# Patient Record
Sex: Male | Born: 1940
Health system: Southern US, Community
[De-identification: ages and names within clinical notes are randomized; demographics above are authoritative.]

## PROBLEM LIST (undated history)

## (undated) DIAGNOSIS — F329 Major depressive disorder, single episode, unspecified: Secondary | ICD-10-CM

## (undated) DIAGNOSIS — F32A Depression, unspecified: Secondary | ICD-10-CM

## (undated) DIAGNOSIS — G709 Myoneural disorder, unspecified: Secondary | ICD-10-CM

## (undated) DIAGNOSIS — Z8719 Personal history of other diseases of the digestive system: Secondary | ICD-10-CM

## (undated) HISTORY — PX: CHOLECYSTECTOMY: SHX55

---

## 1997-10-09 ENCOUNTER — Ambulatory Visit (HOSPITAL_COMMUNITY): Admission: RE | Admit: 1997-10-09 | Discharge: 1997-10-09 | Payer: Self-pay | Admitting: Gastroenterology

## 2001-08-07 ENCOUNTER — Other Ambulatory Visit: Admission: RE | Admit: 2001-08-07 | Discharge: 2001-08-07 | Payer: Self-pay | Admitting: Urology

## 2003-05-26 ENCOUNTER — Ambulatory Visit (HOSPITAL_COMMUNITY): Admission: RE | Admit: 2003-05-26 | Discharge: 2003-05-26 | Payer: Self-pay | Admitting: General Surgery

## 2003-07-29 ENCOUNTER — Ambulatory Visit (HOSPITAL_COMMUNITY): Admission: RE | Admit: 2003-07-29 | Discharge: 2003-07-29 | Payer: Self-pay | Admitting: Family Medicine

## 2004-04-28 ENCOUNTER — Ambulatory Visit (HOSPITAL_COMMUNITY): Admission: RE | Admit: 2004-04-28 | Discharge: 2004-04-28 | Payer: Self-pay | Admitting: Family Medicine

## 2006-08-16 ENCOUNTER — Emergency Department (HOSPITAL_COMMUNITY): Admission: EM | Admit: 2006-08-16 | Discharge: 2006-08-16 | Payer: Self-pay | Admitting: Emergency Medicine

## 2010-05-02 ENCOUNTER — Other Ambulatory Visit (HOSPITAL_COMMUNITY): Payer: Self-pay | Admitting: Pulmonary Disease

## 2010-05-02 ENCOUNTER — Ambulatory Visit (HOSPITAL_COMMUNITY)
Admission: RE | Admit: 2010-05-02 | Discharge: 2010-05-02 | Disposition: A | Discharge status: Home / Self Care | Payer: Medicare Other | Source: Ambulatory Visit | Attending: Pulmonary Disease | Admitting: Pulmonary Disease

## 2010-05-02 DIAGNOSIS — R05 Cough: Secondary | ICD-10-CM | POA: Insufficient documentation

## 2010-05-02 DIAGNOSIS — R059 Cough, unspecified: Secondary | ICD-10-CM | POA: Insufficient documentation

## 2010-08-05 NOTE — H&P (Signed)
NAME:  Norman Blake, Norman Blake NO.:  [id number]   MEDICAL RECORD NO.:  [id number]                  PATIENT TYPE:   LOCATION:                                       FACILITY:   PHYSICIAN:  Dalia Heading, M.D.               DATE OF BIRTH:  March 02, 1941   DATE OF ADMISSION:  05/26/2003  DATE OF DISCHARGE:                                HISTORY & PHYSICAL   CHIEF COMPLAINT:  Need for screening colonoscopy.   HISTORY OF PRESENT ILLNESS:  The patient is a 70 year old white male who is  referred for endoscopic evaluation.  He needs a colonoscopy for screening  purposes.  There is no history of abdominal pain, weight loss, nausea,  vomiting, diarrhea, constipation, melena, or hematochezia.  He last had a  colonoscopy many years ago.  There is no family history of colon carcinoma.   PAST MEDICAL HISTORY:  Unremarkable.   PAST SURGICAL HISTORY:  Laparoscopic cholecystectomy in 2002.   CURRENT MEDICATIONS:  Celebrex, baby aspirin, calcium supplements.   ALLERGIES:  No known drug allergies.   REVIEW OF SYSTEMS:  Noncontributory.   PHYSICAL EXAMINATION:  GENERAL:  The patient is a well-developed, well-  nourished white male in no acute distress.  VITAL SIGNS:  He is afebrile and vital signs are stable.  LUNGS:  Clear to auscultation with equal breath sounds bilaterally.  HEART:  Reveals a regular rate and rhythm without S3, S4, or murmurs.  ABDOMEN:  Soft, nontender, nondistended.  No hepatosplenomegaly or masses  are noted.  RECTAL:  Deferred to the procedure.   IMPRESSION:  Need for screening colonoscopy.   PLAN:  The patient is scheduled for a colonoscopy on May 26, 2003.  The  risks and benefits of the procedure including bleeding and perforation were  fully explained to the patient, who gained informed consent.     ___________________________________________                                         Dalia Heading, M.D.   MAJ/MEDQ  D:  05/14/2003  T:   05/14/2003  Job:  [id number]   cc:   Hanley Hays. Dechurch, M.D.  [address]  [place]  [place] [address]  Fax: [phone]

## 2011-03-30 DIAGNOSIS — S13161A Dislocation of C5/C6 cervical vertebrae, initial encounter: Secondary | ICD-10-CM | POA: Diagnosis not present

## 2011-03-30 DIAGNOSIS — M9981 Other biomechanical lesions of cervical region: Secondary | ICD-10-CM | POA: Diagnosis not present

## 2011-03-30 DIAGNOSIS — S332XXA Dislocation of sacroiliac and sacrococcygeal joint, initial encounter: Secondary | ICD-10-CM | POA: Diagnosis not present

## 2011-03-30 DIAGNOSIS — M999 Biomechanical lesion, unspecified: Secondary | ICD-10-CM | POA: Diagnosis not present

## 2011-04-13 DIAGNOSIS — R972 Elevated prostate specific antigen [PSA]: Secondary | ICD-10-CM | POA: Diagnosis not present

## 2011-04-28 DIAGNOSIS — M999 Biomechanical lesion, unspecified: Secondary | ICD-10-CM | POA: Diagnosis not present

## 2011-04-28 DIAGNOSIS — S13161A Dislocation of C5/C6 cervical vertebrae, initial encounter: Secondary | ICD-10-CM | POA: Diagnosis not present

## 2011-04-28 DIAGNOSIS — S332XXA Dislocation of sacroiliac and sacrococcygeal joint, initial encounter: Secondary | ICD-10-CM | POA: Diagnosis not present

## 2011-04-28 DIAGNOSIS — M9981 Other biomechanical lesions of cervical region: Secondary | ICD-10-CM | POA: Diagnosis not present

## 2011-06-05 DIAGNOSIS — M9981 Other biomechanical lesions of cervical region: Secondary | ICD-10-CM | POA: Diagnosis not present

## 2011-06-05 DIAGNOSIS — S332XXA Dislocation of sacroiliac and sacrococcygeal joint, initial encounter: Secondary | ICD-10-CM | POA: Diagnosis not present

## 2011-06-05 DIAGNOSIS — S13161A Dislocation of C5/C6 cervical vertebrae, initial encounter: Secondary | ICD-10-CM | POA: Diagnosis not present

## 2011-06-05 DIAGNOSIS — M999 Biomechanical lesion, unspecified: Secondary | ICD-10-CM | POA: Diagnosis not present

## 2011-06-19 DIAGNOSIS — M999 Biomechanical lesion, unspecified: Secondary | ICD-10-CM | POA: Diagnosis not present

## 2011-06-19 DIAGNOSIS — S13161A Dislocation of C5/C6 cervical vertebrae, initial encounter: Secondary | ICD-10-CM | POA: Diagnosis not present

## 2011-06-19 DIAGNOSIS — S332XXA Dislocation of sacroiliac and sacrococcygeal joint, initial encounter: Secondary | ICD-10-CM | POA: Diagnosis not present

## 2011-06-19 DIAGNOSIS — M9981 Other biomechanical lesions of cervical region: Secondary | ICD-10-CM | POA: Diagnosis not present

## 2011-06-20 DIAGNOSIS — L989 Disorder of the skin and subcutaneous tissue, unspecified: Secondary | ICD-10-CM | POA: Diagnosis not present

## 2011-06-20 DIAGNOSIS — F4310 Post-traumatic stress disorder, unspecified: Secondary | ICD-10-CM | POA: Diagnosis not present

## 2011-07-03 DIAGNOSIS — L570 Actinic keratosis: Secondary | ICD-10-CM | POA: Diagnosis not present

## 2011-07-03 DIAGNOSIS — D235 Other benign neoplasm of skin of trunk: Secondary | ICD-10-CM | POA: Diagnosis not present

## 2011-07-03 DIAGNOSIS — C44319 Basal cell carcinoma of skin of other parts of face: Secondary | ICD-10-CM | POA: Diagnosis not present

## 2011-07-03 DIAGNOSIS — D485 Neoplasm of uncertain behavior of skin: Secondary | ICD-10-CM | POA: Diagnosis not present

## 2011-07-05 DIAGNOSIS — S332XXA Dislocation of sacroiliac and sacrococcygeal joint, initial encounter: Secondary | ICD-10-CM | POA: Diagnosis not present

## 2011-07-05 DIAGNOSIS — S13161A Dislocation of C5/C6 cervical vertebrae, initial encounter: Secondary | ICD-10-CM | POA: Diagnosis not present

## 2011-07-05 DIAGNOSIS — M999 Biomechanical lesion, unspecified: Secondary | ICD-10-CM | POA: Diagnosis not present

## 2011-07-05 DIAGNOSIS — M9981 Other biomechanical lesions of cervical region: Secondary | ICD-10-CM | POA: Diagnosis not present

## 2011-07-10 DIAGNOSIS — M9981 Other biomechanical lesions of cervical region: Secondary | ICD-10-CM | POA: Diagnosis not present

## 2011-07-10 DIAGNOSIS — M999 Biomechanical lesion, unspecified: Secondary | ICD-10-CM | POA: Diagnosis not present

## 2011-07-10 DIAGNOSIS — S332XXA Dislocation of sacroiliac and sacrococcygeal joint, initial encounter: Secondary | ICD-10-CM | POA: Diagnosis not present

## 2011-07-10 DIAGNOSIS — S13161A Dislocation of C5/C6 cervical vertebrae, initial encounter: Secondary | ICD-10-CM | POA: Diagnosis not present

## 2011-07-21 DIAGNOSIS — R972 Elevated prostate specific antigen [PSA]: Secondary | ICD-10-CM | POA: Diagnosis not present

## 2011-07-26 DIAGNOSIS — M9981 Other biomechanical lesions of cervical region: Secondary | ICD-10-CM | POA: Diagnosis not present

## 2011-07-26 DIAGNOSIS — S13161A Dislocation of C5/C6 cervical vertebrae, initial encounter: Secondary | ICD-10-CM | POA: Diagnosis not present

## 2011-07-26 DIAGNOSIS — M999 Biomechanical lesion, unspecified: Secondary | ICD-10-CM | POA: Diagnosis not present

## 2011-07-26 DIAGNOSIS — S332XXA Dislocation of sacroiliac and sacrococcygeal joint, initial encounter: Secondary | ICD-10-CM | POA: Diagnosis not present

## 2011-08-04 DIAGNOSIS — S332XXA Dislocation of sacroiliac and sacrococcygeal joint, initial encounter: Secondary | ICD-10-CM | POA: Diagnosis not present

## 2011-08-04 DIAGNOSIS — M9981 Other biomechanical lesions of cervical region: Secondary | ICD-10-CM | POA: Diagnosis not present

## 2011-08-04 DIAGNOSIS — S13161A Dislocation of C5/C6 cervical vertebrae, initial encounter: Secondary | ICD-10-CM | POA: Diagnosis not present

## 2011-08-04 DIAGNOSIS — M999 Biomechanical lesion, unspecified: Secondary | ICD-10-CM | POA: Diagnosis not present

## 2011-08-07 DIAGNOSIS — L570 Actinic keratosis: Secondary | ICD-10-CM | POA: Diagnosis not present

## 2011-08-07 DIAGNOSIS — L821 Other seborrheic keratosis: Secondary | ICD-10-CM | POA: Diagnosis not present

## 2011-08-07 DIAGNOSIS — Z85828 Personal history of other malignant neoplasm of skin: Secondary | ICD-10-CM | POA: Diagnosis not present

## 2011-08-07 DIAGNOSIS — D485 Neoplasm of uncertain behavior of skin: Secondary | ICD-10-CM | POA: Diagnosis not present

## 2011-08-09 DIAGNOSIS — M545 Low back pain: Secondary | ICD-10-CM | POA: Diagnosis not present

## 2011-08-23 DIAGNOSIS — M545 Low back pain: Secondary | ICD-10-CM | POA: Diagnosis not present

## 2011-10-06 DIAGNOSIS — M9981 Other biomechanical lesions of cervical region: Secondary | ICD-10-CM | POA: Diagnosis not present

## 2011-10-06 DIAGNOSIS — S13161A Dislocation of C5/C6 cervical vertebrae, initial encounter: Secondary | ICD-10-CM | POA: Diagnosis not present

## 2011-10-06 DIAGNOSIS — S332XXA Dislocation of sacroiliac and sacrococcygeal joint, initial encounter: Secondary | ICD-10-CM | POA: Diagnosis not present

## 2011-10-06 DIAGNOSIS — M999 Biomechanical lesion, unspecified: Secondary | ICD-10-CM | POA: Diagnosis not present

## 2011-10-12 DIAGNOSIS — R972 Elevated prostate specific antigen [PSA]: Secondary | ICD-10-CM | POA: Diagnosis not present

## 2011-10-18 DIAGNOSIS — R351 Nocturia: Secondary | ICD-10-CM | POA: Diagnosis not present

## 2011-10-18 DIAGNOSIS — R339 Retention of urine, unspecified: Secondary | ICD-10-CM | POA: Diagnosis not present

## 2011-10-18 DIAGNOSIS — R350 Frequency of micturition: Secondary | ICD-10-CM | POA: Diagnosis not present

## 2011-10-18 DIAGNOSIS — R972 Elevated prostate specific antigen [PSA]: Secondary | ICD-10-CM | POA: Diagnosis not present

## 2011-10-23 DIAGNOSIS — M9981 Other biomechanical lesions of cervical region: Secondary | ICD-10-CM | POA: Diagnosis not present

## 2011-10-23 DIAGNOSIS — M999 Biomechanical lesion, unspecified: Secondary | ICD-10-CM | POA: Diagnosis not present

## 2011-10-23 DIAGNOSIS — S13161A Dislocation of C5/C6 cervical vertebrae, initial encounter: Secondary | ICD-10-CM | POA: Diagnosis not present

## 2011-10-23 DIAGNOSIS — S332XXA Dislocation of sacroiliac and sacrococcygeal joint, initial encounter: Secondary | ICD-10-CM | POA: Diagnosis not present

## 2011-11-02 DIAGNOSIS — M999 Biomechanical lesion, unspecified: Secondary | ICD-10-CM | POA: Diagnosis not present

## 2011-11-02 DIAGNOSIS — S13161A Dislocation of C5/C6 cervical vertebrae, initial encounter: Secondary | ICD-10-CM | POA: Diagnosis not present

## 2011-11-02 DIAGNOSIS — M9981 Other biomechanical lesions of cervical region: Secondary | ICD-10-CM | POA: Diagnosis not present

## 2011-11-02 DIAGNOSIS — S332XXA Dislocation of sacroiliac and sacrococcygeal joint, initial encounter: Secondary | ICD-10-CM | POA: Diagnosis not present

## 2011-11-14 DIAGNOSIS — R350 Frequency of micturition: Secondary | ICD-10-CM | POA: Diagnosis not present

## 2011-11-14 DIAGNOSIS — R351 Nocturia: Secondary | ICD-10-CM | POA: Diagnosis not present

## 2011-11-14 DIAGNOSIS — R339 Retention of urine, unspecified: Secondary | ICD-10-CM | POA: Diagnosis not present

## 2011-11-29 DIAGNOSIS — S332XXA Dislocation of sacroiliac and sacrococcygeal joint, initial encounter: Secondary | ICD-10-CM | POA: Diagnosis not present

## 2011-11-29 DIAGNOSIS — M9981 Other biomechanical lesions of cervical region: Secondary | ICD-10-CM | POA: Diagnosis not present

## 2011-11-29 DIAGNOSIS — M999 Biomechanical lesion, unspecified: Secondary | ICD-10-CM | POA: Diagnosis not present

## 2011-11-29 DIAGNOSIS — S13161A Dislocation of C5/C6 cervical vertebrae, initial encounter: Secondary | ICD-10-CM | POA: Diagnosis not present

## 2011-12-20 DIAGNOSIS — N400 Enlarged prostate without lower urinary tract symptoms: Secondary | ICD-10-CM | POA: Diagnosis not present

## 2011-12-20 DIAGNOSIS — M545 Low back pain: Secondary | ICD-10-CM | POA: Diagnosis not present

## 2012-01-08 DIAGNOSIS — S13161A Dislocation of C5/C6 cervical vertebrae, initial encounter: Secondary | ICD-10-CM | POA: Diagnosis not present

## 2012-01-08 DIAGNOSIS — M9981 Other biomechanical lesions of cervical region: Secondary | ICD-10-CM | POA: Diagnosis not present

## 2012-01-08 DIAGNOSIS — S332XXA Dislocation of sacroiliac and sacrococcygeal joint, initial encounter: Secondary | ICD-10-CM | POA: Diagnosis not present

## 2012-01-08 DIAGNOSIS — M999 Biomechanical lesion, unspecified: Secondary | ICD-10-CM | POA: Diagnosis not present

## 2012-02-19 DIAGNOSIS — M999 Biomechanical lesion, unspecified: Secondary | ICD-10-CM | POA: Diagnosis not present

## 2012-02-19 DIAGNOSIS — S332XXA Dislocation of sacroiliac and sacrococcygeal joint, initial encounter: Secondary | ICD-10-CM | POA: Diagnosis not present

## 2012-02-19 DIAGNOSIS — M9981 Other biomechanical lesions of cervical region: Secondary | ICD-10-CM | POA: Diagnosis not present

## 2012-02-19 DIAGNOSIS — S13161A Dislocation of C5/C6 cervical vertebrae, initial encounter: Secondary | ICD-10-CM | POA: Diagnosis not present

## 2012-02-26 DIAGNOSIS — M9981 Other biomechanical lesions of cervical region: Secondary | ICD-10-CM | POA: Diagnosis not present

## 2012-02-26 DIAGNOSIS — M999 Biomechanical lesion, unspecified: Secondary | ICD-10-CM | POA: Diagnosis not present

## 2012-02-26 DIAGNOSIS — S13161A Dislocation of C5/C6 cervical vertebrae, initial encounter: Secondary | ICD-10-CM | POA: Diagnosis not present

## 2012-02-26 DIAGNOSIS — S332XXA Dislocation of sacroiliac and sacrococcygeal joint, initial encounter: Secondary | ICD-10-CM | POA: Diagnosis not present

## 2012-02-28 DIAGNOSIS — M999 Biomechanical lesion, unspecified: Secondary | ICD-10-CM | POA: Diagnosis not present

## 2012-02-28 DIAGNOSIS — M9981 Other biomechanical lesions of cervical region: Secondary | ICD-10-CM | POA: Diagnosis not present

## 2012-02-28 DIAGNOSIS — S13161A Dislocation of C5/C6 cervical vertebrae, initial encounter: Secondary | ICD-10-CM | POA: Diagnosis not present

## 2012-02-28 DIAGNOSIS — S332XXA Dislocation of sacroiliac and sacrococcygeal joint, initial encounter: Secondary | ICD-10-CM | POA: Diagnosis not present

## 2012-04-09 DIAGNOSIS — R972 Elevated prostate specific antigen [PSA]: Secondary | ICD-10-CM | POA: Diagnosis not present

## 2012-04-15 DIAGNOSIS — R972 Elevated prostate specific antigen [PSA]: Secondary | ICD-10-CM | POA: Diagnosis not present

## 2012-04-15 DIAGNOSIS — R350 Frequency of micturition: Secondary | ICD-10-CM | POA: Diagnosis not present

## 2012-04-15 DIAGNOSIS — R351 Nocturia: Secondary | ICD-10-CM | POA: Diagnosis not present

## 2012-04-17 DIAGNOSIS — S332XXA Dislocation of sacroiliac and sacrococcygeal joint, initial encounter: Secondary | ICD-10-CM | POA: Diagnosis not present

## 2012-04-17 DIAGNOSIS — M999 Biomechanical lesion, unspecified: Secondary | ICD-10-CM | POA: Diagnosis not present

## 2012-04-17 DIAGNOSIS — S13171A Dislocation of C6/C7 cervical vertebrae, initial encounter: Secondary | ICD-10-CM | POA: Diagnosis not present

## 2012-04-17 DIAGNOSIS — M9981 Other biomechanical lesions of cervical region: Secondary | ICD-10-CM | POA: Diagnosis not present

## 2012-05-17 DIAGNOSIS — S332XXA Dislocation of sacroiliac and sacrococcygeal joint, initial encounter: Secondary | ICD-10-CM | POA: Diagnosis not present

## 2012-05-17 DIAGNOSIS — M999 Biomechanical lesion, unspecified: Secondary | ICD-10-CM | POA: Diagnosis not present

## 2012-05-17 DIAGNOSIS — M9981 Other biomechanical lesions of cervical region: Secondary | ICD-10-CM | POA: Diagnosis not present

## 2012-05-17 DIAGNOSIS — S13171A Dislocation of C6/C7 cervical vertebrae, initial encounter: Secondary | ICD-10-CM | POA: Diagnosis not present

## 2012-05-21 DIAGNOSIS — M9981 Other biomechanical lesions of cervical region: Secondary | ICD-10-CM | POA: Diagnosis not present

## 2012-05-21 DIAGNOSIS — S13171A Dislocation of C6/C7 cervical vertebrae, initial encounter: Secondary | ICD-10-CM | POA: Diagnosis not present

## 2012-05-21 DIAGNOSIS — S332XXA Dislocation of sacroiliac and sacrococcygeal joint, initial encounter: Secondary | ICD-10-CM | POA: Diagnosis not present

## 2012-05-21 DIAGNOSIS — M999 Biomechanical lesion, unspecified: Secondary | ICD-10-CM | POA: Diagnosis not present

## 2012-05-28 DIAGNOSIS — M9981 Other biomechanical lesions of cervical region: Secondary | ICD-10-CM | POA: Diagnosis not present

## 2012-05-28 DIAGNOSIS — S13171A Dislocation of C6/C7 cervical vertebrae, initial encounter: Secondary | ICD-10-CM | POA: Diagnosis not present

## 2012-05-28 DIAGNOSIS — S332XXA Dislocation of sacroiliac and sacrococcygeal joint, initial encounter: Secondary | ICD-10-CM | POA: Diagnosis not present

## 2012-05-28 DIAGNOSIS — M999 Biomechanical lesion, unspecified: Secondary | ICD-10-CM | POA: Diagnosis not present

## 2012-06-18 DIAGNOSIS — S332XXA Dislocation of sacroiliac and sacrococcygeal joint, initial encounter: Secondary | ICD-10-CM | POA: Diagnosis not present

## 2012-06-18 DIAGNOSIS — S13171A Dislocation of C6/C7 cervical vertebrae, initial encounter: Secondary | ICD-10-CM | POA: Diagnosis not present

## 2012-06-18 DIAGNOSIS — M9981 Other biomechanical lesions of cervical region: Secondary | ICD-10-CM | POA: Diagnosis not present

## 2012-06-18 DIAGNOSIS — M999 Biomechanical lesion, unspecified: Secondary | ICD-10-CM | POA: Diagnosis not present

## 2012-06-19 DIAGNOSIS — G589 Mononeuropathy, unspecified: Secondary | ICD-10-CM | POA: Diagnosis not present

## 2012-06-19 DIAGNOSIS — M4800 Spinal stenosis, site unspecified: Secondary | ICD-10-CM | POA: Diagnosis not present

## 2012-06-19 DIAGNOSIS — N400 Enlarged prostate without lower urinary tract symptoms: Secondary | ICD-10-CM | POA: Diagnosis not present

## 2012-06-28 DIAGNOSIS — S13171A Dislocation of C6/C7 cervical vertebrae, initial encounter: Secondary | ICD-10-CM | POA: Diagnosis not present

## 2012-06-28 DIAGNOSIS — M999 Biomechanical lesion, unspecified: Secondary | ICD-10-CM | POA: Diagnosis not present

## 2012-06-28 DIAGNOSIS — S332XXA Dislocation of sacroiliac and sacrococcygeal joint, initial encounter: Secondary | ICD-10-CM | POA: Diagnosis not present

## 2012-06-28 DIAGNOSIS — M9981 Other biomechanical lesions of cervical region: Secondary | ICD-10-CM | POA: Diagnosis not present

## 2012-07-15 DIAGNOSIS — F4310 Post-traumatic stress disorder, unspecified: Secondary | ICD-10-CM | POA: Diagnosis not present

## 2012-07-15 DIAGNOSIS — R42 Dizziness and giddiness: Secondary | ICD-10-CM | POA: Diagnosis not present

## 2012-07-15 DIAGNOSIS — M4800 Spinal stenosis, site unspecified: Secondary | ICD-10-CM | POA: Diagnosis not present

## 2012-07-15 DIAGNOSIS — G589 Mononeuropathy, unspecified: Secondary | ICD-10-CM | POA: Diagnosis not present

## 2012-07-16 DIAGNOSIS — M9981 Other biomechanical lesions of cervical region: Secondary | ICD-10-CM | POA: Diagnosis not present

## 2012-07-16 DIAGNOSIS — M999 Biomechanical lesion, unspecified: Secondary | ICD-10-CM | POA: Diagnosis not present

## 2012-07-16 DIAGNOSIS — S332XXA Dislocation of sacroiliac and sacrococcygeal joint, initial encounter: Secondary | ICD-10-CM | POA: Diagnosis not present

## 2012-07-16 DIAGNOSIS — S13121A Dislocation of C1/C2 cervical vertebrae, initial encounter: Secondary | ICD-10-CM | POA: Diagnosis not present

## 2012-07-25 DIAGNOSIS — S13121A Dislocation of C1/C2 cervical vertebrae, initial encounter: Secondary | ICD-10-CM | POA: Diagnosis not present

## 2012-07-25 DIAGNOSIS — S332XXA Dislocation of sacroiliac and sacrococcygeal joint, initial encounter: Secondary | ICD-10-CM | POA: Diagnosis not present

## 2012-07-25 DIAGNOSIS — M999 Biomechanical lesion, unspecified: Secondary | ICD-10-CM | POA: Diagnosis not present

## 2012-07-25 DIAGNOSIS — M9981 Other biomechanical lesions of cervical region: Secondary | ICD-10-CM | POA: Diagnosis not present

## 2012-08-08 ENCOUNTER — Ambulatory Visit (INDEPENDENT_AMBULATORY_CARE_PROVIDER_SITE_OTHER): Payer: Medicare Other | Admitting: Otolaryngology

## 2012-08-08 DIAGNOSIS — H6120 Impacted cerumen, unspecified ear: Secondary | ICD-10-CM | POA: Diagnosis not present

## 2012-08-08 DIAGNOSIS — H903 Sensorineural hearing loss, bilateral: Secondary | ICD-10-CM | POA: Diagnosis not present

## 2012-08-12 DIAGNOSIS — M9981 Other biomechanical lesions of cervical region: Secondary | ICD-10-CM | POA: Diagnosis not present

## 2012-08-12 DIAGNOSIS — M999 Biomechanical lesion, unspecified: Secondary | ICD-10-CM | POA: Diagnosis not present

## 2012-08-12 DIAGNOSIS — S13121A Dislocation of C1/C2 cervical vertebrae, initial encounter: Secondary | ICD-10-CM | POA: Diagnosis not present

## 2012-08-12 DIAGNOSIS — S332XXA Dislocation of sacroiliac and sacrococcygeal joint, initial encounter: Secondary | ICD-10-CM | POA: Diagnosis not present

## 2012-09-03 DIAGNOSIS — M62838 Other muscle spasm: Secondary | ICD-10-CM | POA: Diagnosis not present

## 2012-09-03 DIAGNOSIS — E538 Deficiency of other specified B group vitamins: Secondary | ICD-10-CM | POA: Diagnosis not present

## 2012-09-03 DIAGNOSIS — M545 Low back pain: Secondary | ICD-10-CM | POA: Diagnosis not present

## 2012-09-03 DIAGNOSIS — G609 Hereditary and idiopathic neuropathy, unspecified: Secondary | ICD-10-CM | POA: Diagnosis not present

## 2012-09-03 DIAGNOSIS — R209 Unspecified disturbances of skin sensation: Secondary | ICD-10-CM | POA: Diagnosis not present

## 2012-09-03 DIAGNOSIS — I2510 Atherosclerotic heart disease of native coronary artery without angina pectoris: Secondary | ICD-10-CM | POA: Diagnosis not present

## 2012-09-03 DIAGNOSIS — Z79899 Other long term (current) drug therapy: Secondary | ICD-10-CM | POA: Diagnosis not present

## 2012-09-03 DIAGNOSIS — R5381 Other malaise: Secondary | ICD-10-CM | POA: Diagnosis not present

## 2012-09-03 DIAGNOSIS — M810 Age-related osteoporosis without current pathological fracture: Secondary | ICD-10-CM | POA: Diagnosis not present

## 2012-09-11 DIAGNOSIS — G894 Chronic pain syndrome: Secondary | ICD-10-CM | POA: Diagnosis not present

## 2012-09-11 DIAGNOSIS — IMO0002 Reserved for concepts with insufficient information to code with codable children: Secondary | ICD-10-CM | POA: Diagnosis not present

## 2012-09-11 DIAGNOSIS — G609 Hereditary and idiopathic neuropathy, unspecified: Secondary | ICD-10-CM | POA: Diagnosis not present

## 2012-10-01 DIAGNOSIS — G609 Hereditary and idiopathic neuropathy, unspecified: Secondary | ICD-10-CM | POA: Diagnosis not present

## 2012-10-01 DIAGNOSIS — G2581 Restless legs syndrome: Secondary | ICD-10-CM | POA: Diagnosis not present

## 2012-10-01 DIAGNOSIS — Z79899 Other long term (current) drug therapy: Secondary | ICD-10-CM | POA: Diagnosis not present

## 2012-10-02 DIAGNOSIS — D235 Other benign neoplasm of skin of trunk: Secondary | ICD-10-CM | POA: Diagnosis not present

## 2012-10-02 DIAGNOSIS — L570 Actinic keratosis: Secondary | ICD-10-CM | POA: Diagnosis not present

## 2012-10-11 DIAGNOSIS — M999 Biomechanical lesion, unspecified: Secondary | ICD-10-CM | POA: Diagnosis not present

## 2012-10-11 DIAGNOSIS — S13121A Dislocation of C1/C2 cervical vertebrae, initial encounter: Secondary | ICD-10-CM | POA: Diagnosis not present

## 2012-10-11 DIAGNOSIS — M9981 Other biomechanical lesions of cervical region: Secondary | ICD-10-CM | POA: Diagnosis not present

## 2012-10-11 DIAGNOSIS — S332XXA Dislocation of sacroiliac and sacrococcygeal joint, initial encounter: Secondary | ICD-10-CM | POA: Diagnosis not present

## 2012-10-14 DIAGNOSIS — R972 Elevated prostate specific antigen [PSA]: Secondary | ICD-10-CM | POA: Diagnosis not present

## 2012-11-12 DIAGNOSIS — Z79899 Other long term (current) drug therapy: Secondary | ICD-10-CM | POA: Diagnosis not present

## 2012-11-12 DIAGNOSIS — R269 Unspecified abnormalities of gait and mobility: Secondary | ICD-10-CM | POA: Diagnosis not present

## 2012-11-12 DIAGNOSIS — G894 Chronic pain syndrome: Secondary | ICD-10-CM | POA: Diagnosis not present

## 2012-11-12 DIAGNOSIS — G609 Hereditary and idiopathic neuropathy, unspecified: Secondary | ICD-10-CM | POA: Diagnosis not present

## 2012-11-14 DIAGNOSIS — M999 Biomechanical lesion, unspecified: Secondary | ICD-10-CM | POA: Diagnosis not present

## 2012-11-14 DIAGNOSIS — S13121A Dislocation of C1/C2 cervical vertebrae, initial encounter: Secondary | ICD-10-CM | POA: Diagnosis not present

## 2012-11-14 DIAGNOSIS — S332XXA Dislocation of sacroiliac and sacrococcygeal joint, initial encounter: Secondary | ICD-10-CM | POA: Diagnosis not present

## 2012-11-14 DIAGNOSIS — M9981 Other biomechanical lesions of cervical region: Secondary | ICD-10-CM | POA: Diagnosis not present

## 2012-11-26 DIAGNOSIS — M999 Biomechanical lesion, unspecified: Secondary | ICD-10-CM | POA: Diagnosis not present

## 2012-11-26 DIAGNOSIS — S332XXA Dislocation of sacroiliac and sacrococcygeal joint, initial encounter: Secondary | ICD-10-CM | POA: Diagnosis not present

## 2012-11-26 DIAGNOSIS — M9981 Other biomechanical lesions of cervical region: Secondary | ICD-10-CM | POA: Diagnosis not present

## 2012-11-26 DIAGNOSIS — S13121A Dislocation of C1/C2 cervical vertebrae, initial encounter: Secondary | ICD-10-CM | POA: Diagnosis not present

## 2012-12-16 DIAGNOSIS — S13121A Dislocation of C1/C2 cervical vertebrae, initial encounter: Secondary | ICD-10-CM | POA: Diagnosis not present

## 2012-12-16 DIAGNOSIS — S332XXA Dislocation of sacroiliac and sacrococcygeal joint, initial encounter: Secondary | ICD-10-CM | POA: Diagnosis not present

## 2012-12-16 DIAGNOSIS — M999 Biomechanical lesion, unspecified: Secondary | ICD-10-CM | POA: Diagnosis not present

## 2012-12-16 DIAGNOSIS — M9981 Other biomechanical lesions of cervical region: Secondary | ICD-10-CM | POA: Diagnosis not present

## 2013-01-01 DIAGNOSIS — G589 Mononeuropathy, unspecified: Secondary | ICD-10-CM | POA: Diagnosis not present

## 2013-01-01 DIAGNOSIS — M4800 Spinal stenosis, site unspecified: Secondary | ICD-10-CM | POA: Diagnosis not present

## 2013-01-01 DIAGNOSIS — F4310 Post-traumatic stress disorder, unspecified: Secondary | ICD-10-CM | POA: Diagnosis not present

## 2013-01-02 DIAGNOSIS — M999 Biomechanical lesion, unspecified: Secondary | ICD-10-CM | POA: Diagnosis not present

## 2013-01-02 DIAGNOSIS — S332XXA Dislocation of sacroiliac and sacrococcygeal joint, initial encounter: Secondary | ICD-10-CM | POA: Diagnosis not present

## 2013-01-02 DIAGNOSIS — S13121A Dislocation of C1/C2 cervical vertebrae, initial encounter: Secondary | ICD-10-CM | POA: Diagnosis not present

## 2013-01-02 DIAGNOSIS — M9981 Other biomechanical lesions of cervical region: Secondary | ICD-10-CM | POA: Diagnosis not present

## 2013-01-06 DIAGNOSIS — Z23 Encounter for immunization: Secondary | ICD-10-CM | POA: Diagnosis not present

## 2013-01-06 DIAGNOSIS — J209 Acute bronchitis, unspecified: Secondary | ICD-10-CM | POA: Diagnosis not present

## 2013-01-16 DIAGNOSIS — Z79899 Other long term (current) drug therapy: Secondary | ICD-10-CM | POA: Diagnosis not present

## 2013-01-16 DIAGNOSIS — G894 Chronic pain syndrome: Secondary | ICD-10-CM | POA: Diagnosis not present

## 2013-01-16 DIAGNOSIS — M25569 Pain in unspecified knee: Secondary | ICD-10-CM | POA: Diagnosis not present

## 2013-01-16 DIAGNOSIS — G609 Hereditary and idiopathic neuropathy, unspecified: Secondary | ICD-10-CM | POA: Diagnosis not present

## 2013-01-21 DIAGNOSIS — S23101A Dislocation of unspecified thoracic vertebra, initial encounter: Secondary | ICD-10-CM | POA: Diagnosis not present

## 2013-01-21 DIAGNOSIS — S332XXA Dislocation of sacroiliac and sacrococcygeal joint, initial encounter: Secondary | ICD-10-CM | POA: Diagnosis not present

## 2013-01-21 DIAGNOSIS — M999 Biomechanical lesion, unspecified: Secondary | ICD-10-CM | POA: Diagnosis not present

## 2013-03-17 DIAGNOSIS — G609 Hereditary and idiopathic neuropathy, unspecified: Secondary | ICD-10-CM | POA: Diagnosis not present

## 2013-03-17 DIAGNOSIS — Z79899 Other long term (current) drug therapy: Secondary | ICD-10-CM | POA: Diagnosis not present

## 2013-03-17 DIAGNOSIS — R42 Dizziness and giddiness: Secondary | ICD-10-CM | POA: Diagnosis not present

## 2013-03-17 DIAGNOSIS — G894 Chronic pain syndrome: Secondary | ICD-10-CM | POA: Diagnosis not present

## 2013-03-27 DIAGNOSIS — M999 Biomechanical lesion, unspecified: Secondary | ICD-10-CM | POA: Diagnosis not present

## 2013-03-27 DIAGNOSIS — S332XXA Dislocation of sacroiliac and sacrococcygeal joint, initial encounter: Secondary | ICD-10-CM | POA: Diagnosis not present

## 2013-03-27 DIAGNOSIS — S23101A Dislocation of unspecified thoracic vertebra, initial encounter: Secondary | ICD-10-CM | POA: Diagnosis not present

## 2013-04-08 DIAGNOSIS — R972 Elevated prostate specific antigen [PSA]: Secondary | ICD-10-CM | POA: Diagnosis not present

## 2013-04-11 DIAGNOSIS — S332XXA Dislocation of sacroiliac and sacrococcygeal joint, initial encounter: Secondary | ICD-10-CM | POA: Diagnosis not present

## 2013-04-11 DIAGNOSIS — M999 Biomechanical lesion, unspecified: Secondary | ICD-10-CM | POA: Diagnosis not present

## 2013-04-11 DIAGNOSIS — S23101A Dislocation of unspecified thoracic vertebra, initial encounter: Secondary | ICD-10-CM | POA: Diagnosis not present

## 2013-04-15 DIAGNOSIS — R350 Frequency of micturition: Secondary | ICD-10-CM | POA: Diagnosis not present

## 2013-04-15 DIAGNOSIS — R39198 Other difficulties with micturition: Secondary | ICD-10-CM | POA: Diagnosis not present

## 2013-04-15 DIAGNOSIS — R972 Elevated prostate specific antigen [PSA]: Secondary | ICD-10-CM | POA: Diagnosis not present

## 2013-04-15 DIAGNOSIS — R351 Nocturia: Secondary | ICD-10-CM | POA: Diagnosis not present

## 2013-04-22 DIAGNOSIS — S23101A Dislocation of unspecified thoracic vertebra, initial encounter: Secondary | ICD-10-CM | POA: Diagnosis not present

## 2013-04-22 DIAGNOSIS — S332XXA Dislocation of sacroiliac and sacrococcygeal joint, initial encounter: Secondary | ICD-10-CM | POA: Diagnosis not present

## 2013-04-22 DIAGNOSIS — M999 Biomechanical lesion, unspecified: Secondary | ICD-10-CM | POA: Diagnosis not present

## 2013-06-06 DIAGNOSIS — S332XXA Dislocation of sacroiliac and sacrococcygeal joint, initial encounter: Secondary | ICD-10-CM | POA: Diagnosis not present

## 2013-06-06 DIAGNOSIS — S23101A Dislocation of unspecified thoracic vertebra, initial encounter: Secondary | ICD-10-CM | POA: Diagnosis not present

## 2013-06-06 DIAGNOSIS — M999 Biomechanical lesion, unspecified: Secondary | ICD-10-CM | POA: Diagnosis not present

## 2013-06-10 DIAGNOSIS — Z79899 Other long term (current) drug therapy: Secondary | ICD-10-CM | POA: Diagnosis not present

## 2013-06-10 DIAGNOSIS — G609 Hereditary and idiopathic neuropathy, unspecified: Secondary | ICD-10-CM | POA: Diagnosis not present

## 2013-06-10 DIAGNOSIS — G894 Chronic pain syndrome: Secondary | ICD-10-CM | POA: Diagnosis not present

## 2013-06-10 DIAGNOSIS — M25569 Pain in unspecified knee: Secondary | ICD-10-CM | POA: Diagnosis not present

## 2013-07-03 DIAGNOSIS — G619 Inflammatory polyneuropathy, unspecified: Secondary | ICD-10-CM | POA: Diagnosis not present

## 2013-07-03 DIAGNOSIS — N400 Enlarged prostate without lower urinary tract symptoms: Secondary | ICD-10-CM | POA: Diagnosis not present

## 2013-07-03 DIAGNOSIS — F4310 Post-traumatic stress disorder, unspecified: Secondary | ICD-10-CM | POA: Diagnosis not present

## 2013-07-03 DIAGNOSIS — M4800 Spinal stenosis, site unspecified: Secondary | ICD-10-CM | POA: Diagnosis not present

## 2013-07-08 DIAGNOSIS — S23101A Dislocation of unspecified thoracic vertebra, initial encounter: Secondary | ICD-10-CM | POA: Diagnosis not present

## 2013-07-08 DIAGNOSIS — S332XXA Dislocation of sacroiliac and sacrococcygeal joint, initial encounter: Secondary | ICD-10-CM | POA: Diagnosis not present

## 2013-07-08 DIAGNOSIS — M999 Biomechanical lesion, unspecified: Secondary | ICD-10-CM | POA: Diagnosis not present

## 2013-08-05 DIAGNOSIS — S332XXA Dislocation of sacroiliac and sacrococcygeal joint, initial encounter: Secondary | ICD-10-CM | POA: Diagnosis not present

## 2013-08-05 DIAGNOSIS — S23101A Dislocation of unspecified thoracic vertebra, initial encounter: Secondary | ICD-10-CM | POA: Diagnosis not present

## 2013-08-05 DIAGNOSIS — M999 Biomechanical lesion, unspecified: Secondary | ICD-10-CM | POA: Diagnosis not present

## 2013-08-08 DIAGNOSIS — Z79899 Other long term (current) drug therapy: Secondary | ICD-10-CM | POA: Diagnosis not present

## 2013-08-08 DIAGNOSIS — G894 Chronic pain syndrome: Secondary | ICD-10-CM | POA: Diagnosis not present

## 2013-08-08 DIAGNOSIS — M25569 Pain in unspecified knee: Secondary | ICD-10-CM | POA: Diagnosis not present

## 2013-08-08 DIAGNOSIS — G609 Hereditary and idiopathic neuropathy, unspecified: Secondary | ICD-10-CM | POA: Diagnosis not present

## 2013-09-01 DIAGNOSIS — M999 Biomechanical lesion, unspecified: Secondary | ICD-10-CM | POA: Diagnosis not present

## 2013-09-01 DIAGNOSIS — S23101A Dislocation of unspecified thoracic vertebra, initial encounter: Secondary | ICD-10-CM | POA: Diagnosis not present

## 2013-09-01 DIAGNOSIS — S332XXA Dislocation of sacroiliac and sacrococcygeal joint, initial encounter: Secondary | ICD-10-CM | POA: Diagnosis not present

## 2013-09-19 DIAGNOSIS — S13121A Dislocation of C1/C2 cervical vertebrae, initial encounter: Secondary | ICD-10-CM | POA: Diagnosis not present

## 2013-09-19 DIAGNOSIS — M9981 Other biomechanical lesions of cervical region: Secondary | ICD-10-CM | POA: Diagnosis not present

## 2013-10-13 DIAGNOSIS — R972 Elevated prostate specific antigen [PSA]: Secondary | ICD-10-CM | POA: Diagnosis not present

## 2013-10-20 DIAGNOSIS — M9981 Other biomechanical lesions of cervical region: Secondary | ICD-10-CM | POA: Diagnosis not present

## 2013-10-20 DIAGNOSIS — S13121A Dislocation of C1/C2 cervical vertebrae, initial encounter: Secondary | ICD-10-CM | POA: Diagnosis not present

## 2013-11-06 DIAGNOSIS — G894 Chronic pain syndrome: Secondary | ICD-10-CM | POA: Diagnosis not present

## 2013-11-06 DIAGNOSIS — M25569 Pain in unspecified knee: Secondary | ICD-10-CM | POA: Diagnosis not present

## 2013-11-06 DIAGNOSIS — G609 Hereditary and idiopathic neuropathy, unspecified: Secondary | ICD-10-CM | POA: Diagnosis not present

## 2013-11-20 DIAGNOSIS — S13121A Dislocation of C1/C2 cervical vertebrae, initial encounter: Secondary | ICD-10-CM | POA: Diagnosis not present

## 2013-11-20 DIAGNOSIS — M9981 Other biomechanical lesions of cervical region: Secondary | ICD-10-CM | POA: Diagnosis not present

## 2013-12-11 DIAGNOSIS — M999 Biomechanical lesion, unspecified: Secondary | ICD-10-CM | POA: Diagnosis not present

## 2013-12-11 DIAGNOSIS — S13121A Dislocation of C1/C2 cervical vertebrae, initial encounter: Secondary | ICD-10-CM | POA: Diagnosis not present

## 2013-12-11 DIAGNOSIS — M9981 Other biomechanical lesions of cervical region: Secondary | ICD-10-CM | POA: Diagnosis not present

## 2013-12-11 DIAGNOSIS — S332XXA Dislocation of sacroiliac and sacrococcygeal joint, initial encounter: Secondary | ICD-10-CM | POA: Diagnosis not present

## 2014-01-05 DIAGNOSIS — S332XXA Dislocation of sacroiliac and sacrococcygeal joint, initial encounter: Secondary | ICD-10-CM | POA: Diagnosis not present

## 2014-01-05 DIAGNOSIS — M9904 Segmental and somatic dysfunction of sacral region: Secondary | ICD-10-CM | POA: Diagnosis not present

## 2014-01-05 DIAGNOSIS — M9901 Segmental and somatic dysfunction of cervical region: Secondary | ICD-10-CM | POA: Diagnosis not present

## 2014-01-05 DIAGNOSIS — S13110A Subluxation of C0/C1 cervical vertebrae, initial encounter: Secondary | ICD-10-CM | POA: Diagnosis not present

## 2014-01-06 DIAGNOSIS — M4800 Spinal stenosis, site unspecified: Secondary | ICD-10-CM | POA: Diagnosis not present

## 2014-01-06 DIAGNOSIS — Z23 Encounter for immunization: Secondary | ICD-10-CM | POA: Diagnosis not present

## 2014-01-06 DIAGNOSIS — N400 Enlarged prostate without lower urinary tract symptoms: Secondary | ICD-10-CM | POA: Diagnosis not present

## 2014-01-06 DIAGNOSIS — G609 Hereditary and idiopathic neuropathy, unspecified: Secondary | ICD-10-CM | POA: Diagnosis not present

## 2014-02-06 DIAGNOSIS — M9904 Segmental and somatic dysfunction of sacral region: Secondary | ICD-10-CM | POA: Diagnosis not present

## 2014-02-06 DIAGNOSIS — S332XXA Dislocation of sacroiliac and sacrococcygeal joint, initial encounter: Secondary | ICD-10-CM | POA: Diagnosis not present

## 2014-03-05 DIAGNOSIS — S13160A Subluxation of C5/C6 cervical vertebrae, initial encounter: Secondary | ICD-10-CM | POA: Diagnosis not present

## 2014-03-05 DIAGNOSIS — S332XXA Dislocation of sacroiliac and sacrococcygeal joint, initial encounter: Secondary | ICD-10-CM | POA: Diagnosis not present

## 2014-03-05 DIAGNOSIS — M9901 Segmental and somatic dysfunction of cervical region: Secondary | ICD-10-CM | POA: Diagnosis not present

## 2014-03-05 DIAGNOSIS — M9904 Segmental and somatic dysfunction of sacral region: Secondary | ICD-10-CM | POA: Diagnosis not present

## 2014-03-23 DIAGNOSIS — M9904 Segmental and somatic dysfunction of sacral region: Secondary | ICD-10-CM | POA: Diagnosis not present

## 2014-03-23 DIAGNOSIS — S332XXA Dislocation of sacroiliac and sacrococcygeal joint, initial encounter: Secondary | ICD-10-CM | POA: Diagnosis not present

## 2014-03-23 DIAGNOSIS — S13160A Subluxation of C5/C6 cervical vertebrae, initial encounter: Secondary | ICD-10-CM | POA: Diagnosis not present

## 2014-03-23 DIAGNOSIS — M9901 Segmental and somatic dysfunction of cervical region: Secondary | ICD-10-CM | POA: Diagnosis not present

## 2014-03-31 DIAGNOSIS — K580 Irritable bowel syndrome with diarrhea: Secondary | ICD-10-CM | POA: Diagnosis not present

## 2014-03-31 DIAGNOSIS — N401 Enlarged prostate with lower urinary tract symptoms: Secondary | ICD-10-CM | POA: Diagnosis not present

## 2014-03-31 DIAGNOSIS — F410 Panic disorder [episodic paroxysmal anxiety] without agoraphobia: Secondary | ICD-10-CM | POA: Diagnosis not present

## 2014-03-31 DIAGNOSIS — M4800 Spinal stenosis, site unspecified: Secondary | ICD-10-CM | POA: Diagnosis not present

## 2014-03-31 DIAGNOSIS — F4310 Post-traumatic stress disorder, unspecified: Secondary | ICD-10-CM | POA: Diagnosis not present

## 2014-04-02 DIAGNOSIS — M4800 Spinal stenosis, site unspecified: Secondary | ICD-10-CM | POA: Diagnosis not present

## 2014-04-02 DIAGNOSIS — F4310 Post-traumatic stress disorder, unspecified: Secondary | ICD-10-CM | POA: Diagnosis not present

## 2014-04-02 DIAGNOSIS — N401 Enlarged prostate with lower urinary tract symptoms: Secondary | ICD-10-CM | POA: Diagnosis not present

## 2014-04-02 DIAGNOSIS — K580 Irritable bowel syndrome with diarrhea: Secondary | ICD-10-CM | POA: Diagnosis not present

## 2014-04-08 DIAGNOSIS — R972 Elevated prostate specific antigen [PSA]: Secondary | ICD-10-CM | POA: Diagnosis not present

## 2014-04-15 DIAGNOSIS — R3912 Poor urinary stream: Secondary | ICD-10-CM | POA: Diagnosis not present

## 2014-04-15 DIAGNOSIS — R972 Elevated prostate specific antigen [PSA]: Secondary | ICD-10-CM | POA: Diagnosis not present

## 2014-04-15 DIAGNOSIS — R351 Nocturia: Secondary | ICD-10-CM | POA: Diagnosis not present

## 2014-04-15 DIAGNOSIS — R350 Frequency of micturition: Secondary | ICD-10-CM | POA: Diagnosis not present

## 2014-04-27 DIAGNOSIS — M9904 Segmental and somatic dysfunction of sacral region: Secondary | ICD-10-CM | POA: Diagnosis not present

## 2014-04-27 DIAGNOSIS — M9901 Segmental and somatic dysfunction of cervical region: Secondary | ICD-10-CM | POA: Diagnosis not present

## 2014-04-27 DIAGNOSIS — S13160A Subluxation of C5/C6 cervical vertebrae, initial encounter: Secondary | ICD-10-CM | POA: Diagnosis not present

## 2014-04-27 DIAGNOSIS — S332XXA Dislocation of sacroiliac and sacrococcygeal joint, initial encounter: Secondary | ICD-10-CM | POA: Diagnosis not present

## 2014-04-30 DIAGNOSIS — Z1211 Encounter for screening for malignant neoplasm of colon: Secondary | ICD-10-CM | POA: Diagnosis not present

## 2014-05-07 DIAGNOSIS — Z79899 Other long term (current) drug therapy: Secondary | ICD-10-CM | POA: Diagnosis not present

## 2014-05-07 DIAGNOSIS — G629 Polyneuropathy, unspecified: Secondary | ICD-10-CM | POA: Diagnosis not present

## 2014-05-07 DIAGNOSIS — M79606 Pain in leg, unspecified: Secondary | ICD-10-CM | POA: Diagnosis not present

## 2014-05-07 DIAGNOSIS — G894 Chronic pain syndrome: Secondary | ICD-10-CM | POA: Diagnosis not present

## 2014-05-11 NOTE — H&P (Signed)
  NTS SOAP Note  Vital Signs:  Vitals as of: 7/53/0051: Systolic 102: Diastolic 82: Heart Rate 60: Temp 98.83F: Height 62ft 3in: Weight 232Lbs 0 Ounces: BMI 29  BMI : 29 kg/m2  Subjective: This 74 year old male presents fora screening TCS. Last had a TCS over ten years ago.  Had loose stools recently,  but stool culture negative.  Review of Symptoms:  Constitutional:unremarkable   dizzy spells Eyes:unremarkable   Nose/Mouth/Throat:unremarkable Cardiovascular:  unremarkable Respiratory:unremarkable Gastrointestinal:  unremarkable   Genitourinary:unremarkable   Musculoskeletal:unremarkable Skin:unremarkable Hematolgic/Lymphatic:unremarkable   Allergic/Immunologic:unremarkable   Past Medical History:  Reviewed  Past Medical History  Surgical History: cholecystectomy Medical Problems: BPH,  neuropathy,  glaucoma,  spinal stenosis Allergies: lipitor Medications: voltaren,  tramadol,  flinasteride,  trazadone,  lyrica,  flomax,  simvastatin,    Social History:Reviewed  Social History  Preferred Language: English Race:  White Ethnicity: Not Hispanic / Latino Age: 89 year Marital Status:  M Alcohol: no   Smoking Status: Never smoker reviewed on 04/30/2014 Functional Status reviewed on 04/30/2014 ------------------------------------------------ Bathing: Normal Cooking: Normal Dressing: Normal Driving: Normal Eating: Normal Managing Meds: Normal Oral Care: Normal Shopping: Normal Toileting: Normal Transferring: Normal Walking: Normal Cognitive Status reviewed on 04/30/2014 ------------------------------------------------ Attention: Normal Decision Making: Normal Language: Normal Memory: Normal Motor: Normal Perception: Normal Problem Solving: Normal Visual and Spatial: Normal   Family History:Reviewed  Family Health History Mother, Deceased; Cancer unspecified;  Father, Deceased; Cancer unspecified;     Objective  Information: General:Well appearing, well nourished in no distress. Heart:RRR, no murmur or gallop.  Normal S1, S2.  No S3, S4.  Lungs:  CTA bilaterally, no wheezes, rhonchi, rales.  Breathing unlabored. Abdomen:Soft, NT/ND, no HSM, no masses. deferred to procedure  Assessment:Need for screening TCS  Diagnoses: V76.51  Z12.11 Screening for malignant neoplasm of colon (Encounter for screening for malignant neoplasm of colon)  Procedures: 11173 - OFFICE OUTPATIENT NEW 20 MINUTES    Plan:  Schedule for colonoscopy on 06/09/14.   Patient Education:Alternative treatments to surgery were discussed with patient (and family).  Risks and benefits  of procedure including bleeding and perforation were fully explained to the patient (and family) who gave informed consent. Patient/family questions were addressed.  Follow-up:Pending Surgery

## 2014-06-09 ENCOUNTER — Ambulatory Visit (HOSPITAL_COMMUNITY)
Admission: RE | Admit: 2014-06-09 | Discharge: 2014-06-09 | Disposition: A | Discharge status: Home / Self Care | Payer: Medicare Other | Source: Ambulatory Visit | Attending: General Surgery | Admitting: General Surgery

## 2014-06-09 ENCOUNTER — Encounter (HOSPITAL_COMMUNITY): Payer: Self-pay | Admitting: *Deleted

## 2014-06-09 ENCOUNTER — Encounter (HOSPITAL_COMMUNITY)
Admission: RE | Disposition: A | Discharge status: Home / Self Care | Payer: Self-pay | Source: Ambulatory Visit | Attending: General Surgery

## 2014-06-09 DIAGNOSIS — H409 Unspecified glaucoma: Secondary | ICD-10-CM | POA: Insufficient documentation

## 2014-06-09 DIAGNOSIS — Z9049 Acquired absence of other specified parts of digestive tract: Secondary | ICD-10-CM | POA: Insufficient documentation

## 2014-06-09 DIAGNOSIS — Z1211 Encounter for screening for malignant neoplasm of colon: Secondary | ICD-10-CM | POA: Diagnosis not present

## 2014-06-09 DIAGNOSIS — K648 Other hemorrhoids: Secondary | ICD-10-CM | POA: Diagnosis not present

## 2014-06-09 DIAGNOSIS — K5730 Diverticulosis of large intestine without perforation or abscess without bleeding: Secondary | ICD-10-CM | POA: Diagnosis not present

## 2014-06-09 DIAGNOSIS — G629 Polyneuropathy, unspecified: Secondary | ICD-10-CM | POA: Diagnosis not present

## 2014-06-09 DIAGNOSIS — Z79899 Other long term (current) drug therapy: Secondary | ICD-10-CM | POA: Diagnosis not present

## 2014-06-09 DIAGNOSIS — Z79891 Long term (current) use of opiate analgesic: Secondary | ICD-10-CM | POA: Insufficient documentation

## 2014-06-09 DIAGNOSIS — Z791 Long term (current) use of non-steroidal anti-inflammatories (NSAID): Secondary | ICD-10-CM | POA: Diagnosis not present

## 2014-06-09 DIAGNOSIS — N400 Enlarged prostate without lower urinary tract symptoms: Secondary | ICD-10-CM | POA: Insufficient documentation

## 2014-06-09 HISTORY — PX: COLONOSCOPY: SHX5424

## 2014-06-09 HISTORY — DX: Major depressive disorder, single episode, unspecified: F32.9

## 2014-06-09 HISTORY — DX: Myoneural disorder, unspecified: G70.9

## 2014-06-09 HISTORY — DX: Personal history of other diseases of the digestive system: Z87.19

## 2014-06-09 HISTORY — DX: Depression, unspecified: F32.A

## 2014-06-09 SURGERY — COLONOSCOPY
Anesthesia: Moderate Sedation

## 2014-06-09 MED ORDER — STERILE WATER FOR IRRIGATION IR SOLN
Status: DC | PRN
  Administered 2014-06-09: 08:00:00

## 2014-06-09 MED ORDER — MEPERIDINE HCL 50 MG/ML IJ SOLN
INTRAMUSCULAR | Status: DC | PRN
  Administered 2014-06-09: 50 mg via INTRAVENOUS

## 2014-06-09 MED ORDER — ATROPINE SULFATE 1 MG/ML IJ SOLN
INTRAMUSCULAR | Status: AC
  Filled 2014-06-09: qty 1

## 2014-06-09 MED ORDER — ATROPINE SULFATE 1 MG/ML IJ SOLN
INTRAMUSCULAR | Status: DC | PRN
  Administered 2014-06-09: .5 mg via INTRAVENOUS

## 2014-06-09 MED ORDER — MIDAZOLAM HCL 5 MG/5ML IJ SOLN
INTRAMUSCULAR | Status: DC | PRN
  Administered 2014-06-09: 3 mg via INTRAVENOUS

## 2014-06-09 MED ORDER — SODIUM CHLORIDE 0.9 % IV SOLN
INTRAVENOUS | Status: DC
  Administered 2014-06-09: 08:00:00 via INTRAVENOUS

## 2014-06-09 MED ORDER — MIDAZOLAM HCL 5 MG/5ML IJ SOLN
INTRAMUSCULAR | Status: AC
  Filled 2014-06-09: qty 5

## 2014-06-09 MED ORDER — MEPERIDINE HCL 50 MG/ML IJ SOLN
INTRAMUSCULAR | Status: AC
  Filled 2014-06-09: qty 1

## 2014-06-09 NOTE — Op Note (Signed)
[place], [id number]   COLONOSCOPY PROCEDURE REPORT     EXAM DATE: 2014/06/21  PATIENT NAME:      Norman Blake, Norman Blake           MR #:      [id number]  BIRTHDATE:       1940/09/27      VISIT #:     [id number]  ATTENDING:     Aviva Signs, MD     STATUS:     outpatient ASSISTANT:  INDICATIONS:  The patient is a 74 yr old male here for a colonoscopy due to average risk patient for colon cancer. PROCEDURE PERFORMED:     Colonoscopy, screening MEDICATIONS:     Demerol 50 mg IV, Versed 3 mg IV, and Atropine .5 mg IV ESTIMATED BLOOD LOSS:     None  CONSENT: The patient understands the risks and benefits of the procedure and understands that these risks include, but are not limited to: sedation, allergic reaction, infection, perforation and/or bleeding. Alternative means of evaluation and treatment include, among others: physical exam, x-rays, and/or surgical intervention. The patient elects to proceed with this endoscopic procedure.  DESCRIPTION OF PROCEDURE: During intra-op preparation period all mechanical & medical equipment was checked for proper function. Hand hygiene and appropriate measures for infection prevention was taken. After the risks, benefits and alternatives of the procedure were thoroughly explained, Informed consent was verified, confirmed and timeout was successfully executed by the treatment team. A digital exam revealed no abnormalities of the rectum. The [id number] ([id number]) endoscope was introduced through the anus and advanced to the cecum, which was identified by both the appendix and ileocecal valve. (Trilyte was used) adequate The instrument was then slowly withdrawn as the colon was fully examined.Estimated blood loss is zero unless otherwise noted in this procedure report.   COLON FINDINGS: There was moderate diverticulosis noted in the left colon.   The examination was otherwise normal. Retroflexed  views revealed internal hemorrhoids. The scope was then completely withdrawn from the patient and the procedure terminated. SCOPE WITHDRAWAL TIME: 6    ADVERSE EVENTS:      There were no immediate complications.  IMPRESSIONS:     1.  Moderate diverticulosis was noted in the left colon 2.  The examination was otherwise normal  RECOMMENDATIONS:     Repeat Colonscopy in 10 years. RECALL:  _____________________________ Aviva Signs, MD eSigned:  Aviva Signs, MD 06-21-14 8:35 AM   cc:   CPT CODES: ICD CODES:  The ICD and CPT codes recommended by this software are interpretations from the data that the clinical staff has captured with the software.  The verification of the translation of this report to the ICD and CPT codes and modifiers is the sole responsibility of the health care institution and practicing physician where this report was generated.  [employer] will not be held responsible for the validity of the ICD and CPT codes included on this report.  AMA assumes no liability for data contained or not contained herein. [employer] is a Designer, television/film set of the [employer].

## 2014-06-09 NOTE — Discharge Instructions (Signed)
Diverticulosis °Diverticulosis is the condition that develops when small pouches (diverticula) form in the wall of your colon. Your colon, or large intestine, is where water is absorbed and stool is formed. The pouches form when the inside layer of your colon pushes through weak spots in the outer layers of your colon. °CAUSES  °No one knows exactly what causes diverticulosis. °RISK FACTORS °· Being older than 50. Your risk for this condition increases with age. Diverticulosis is rare in people younger than 40 years. By age 80, almost everyone has it. °· Eating a low-fiber diet. °· Being frequently constipated. °· Being overweight. °· Not getting enough exercise. °· Smoking. °· Taking over-the-counter pain medicines, like aspirin and ibuprofen. °SYMPTOMS  °Most people with diverticulosis do not have symptoms. °DIAGNOSIS  °Because diverticulosis often has no symptoms, health care providers often discover the condition during an exam for other colon problems. In many cases, a health care provider will diagnose diverticulosis while using a flexible scope to examine the colon (colonoscopy). °TREATMENT  °If you have never developed an infection related to diverticulosis, you may not need treatment. If you have had an infection before, treatment may include: °· Eating more fruits, vegetables, and grains. °· Taking a fiber supplement. °· Taking a live bacteria supplement (probiotic). °· Taking medicine to relax your colon. °HOME CARE INSTRUCTIONS  °· Drink at least 6-8 glasses of water each day to prevent constipation. °· Try not to strain when you have a bowel movement. °· Keep all follow-up appointments. °If you have had an infection before:  °· Increase the fiber in your diet as directed by your health care provider or dietitian. °· Take a dietary fiber supplement if your health care provider approves. °· Only take medicines as directed by your health care provider. °SEEK MEDICAL CARE IF:  °· You have abdominal  pain. °· You have bloating. °· You have cramps. °· You have not gone to the bathroom in 3 days. °SEEK IMMEDIATE MEDICAL CARE IF:  °· Your pain gets worse. °· Your bloating becomes very bad. °· You have a fever or chills, and your symptoms suddenly get worse. °· You begin vomiting. °· You have bowel movements that are bloody or black. °MAKE SURE YOU: °· Understand these instructions. °· Will watch your condition. °· Will get help right away if you are not doing well or get worse. °Document Released: 12/02/2003 Document Revised: 03/11/2013 Document Reviewed: 01/29/2013 °ExitCare® Patient Information ©2015 [employer]. This information is not intended to replace advice given to you by your health care provider. Make sure you discuss any questions you have with your health care provider. °Colonoscopy, Care After °Refer to this sheet in the next few weeks. These instructions provide you with information on caring for yourself after your procedure. Your health care provider may also give you more specific instructions. Your treatment has been planned according to current medical practices, but problems sometimes occur. Call your health care provider if you have any problems or questions after your procedure. °WHAT TO EXPECT AFTER THE PROCEDURE  °After your procedure, it is typical to have the following: °· A small amount of blood in your stool. °· Moderate amounts of gas and mild abdominal cramping or bloating. °HOME CARE INSTRUCTIONS °· Do not drive, operate machinery, or sign important documents for 24 hours. °· You may shower and resume your regular physical activities, but move at a slower pace for the first 24 hours. °· Take frequent rest periods for the first 24 hours. °· Walk   around or put a warm pack on your abdomen to help reduce abdominal cramping and bloating. °· Drink enough fluids to keep your urine clear or pale yellow. °· You may resume your normal diet as instructed by your health care provider. Avoid  heavy or fried foods that are hard to digest. °· Avoid drinking alcohol for 24 hours or as instructed by your health care provider. °· Only take over-the-counter or prescription medicines as directed by your health care provider. °· If a tissue sample (biopsy) was taken during your procedure: °¨ Do not take aspirin or blood thinners for 7 days, or as instructed by your health care provider. °¨ Do not drink alcohol for 7 days, or as instructed by your health care provider. °¨ Eat soft foods for the first 24 hours. °SEEK MEDICAL CARE IF: °You have persistent spotting of blood in your stool 2-3 days after the procedure. °SEEK IMMEDIATE MEDICAL CARE IF: °· You have more than a small spotting of blood in your stool. °· You pass large blood clots in your stool. °· Your abdomen is swollen (distended). °· You have nausea or vomiting. °· You have a fever. °· You have increasing abdominal pain that is not relieved with medicine. °Document Released: 10/19/2003 Document Revised: 12/25/2012 Document Reviewed: 11/11/2012 °ExitCare® Patient Information ©2015 [employer]. This information is not intended to replace advice given to you by your health care provider. Make sure you discuss any questions you have with your health care provider. ° °

## 2014-06-09 NOTE — Interval H&P Note (Signed)
History and Physical Interval Note:  06/09/2014 8:10 AM  Norman Blake  has presented today for surgery, with the diagnosis of screening  The various methods of treatment have been discussed with the patient and family. After consideration of risks, benefits and other options for treatment, the patient has consented to  Procedure(s): COLONOSCOPY (N/A) as a surgical intervention .  The patient's history has been reviewed, patient examined, no change in status, stable for surgery.  I have reviewed the patient's chart and labs.  Questions were answered to the patient's satisfaction.     Aviva Signs A

## 2014-06-10 ENCOUNTER — Encounter (HOSPITAL_COMMUNITY): Payer: Self-pay | Admitting: General Surgery

## 2014-06-18 DIAGNOSIS — F4310 Post-traumatic stress disorder, unspecified: Secondary | ICD-10-CM | POA: Diagnosis not present

## 2014-06-18 DIAGNOSIS — G609 Hereditary and idiopathic neuropathy, unspecified: Secondary | ICD-10-CM | POA: Diagnosis not present

## 2014-06-18 DIAGNOSIS — M4800 Spinal stenosis, site unspecified: Secondary | ICD-10-CM | POA: Diagnosis not present

## 2014-07-07 DIAGNOSIS — M79606 Pain in leg, unspecified: Secondary | ICD-10-CM | POA: Diagnosis not present

## 2014-07-07 DIAGNOSIS — M9904 Segmental and somatic dysfunction of sacral region: Secondary | ICD-10-CM | POA: Diagnosis not present

## 2014-07-07 DIAGNOSIS — Z79899 Other long term (current) drug therapy: Secondary | ICD-10-CM | POA: Diagnosis not present

## 2014-07-07 DIAGNOSIS — G894 Chronic pain syndrome: Secondary | ICD-10-CM | POA: Diagnosis not present

## 2014-07-07 DIAGNOSIS — S332XXA Dislocation of sacroiliac and sacrococcygeal joint, initial encounter: Secondary | ICD-10-CM | POA: Diagnosis not present

## 2014-07-07 DIAGNOSIS — M9902 Segmental and somatic dysfunction of thoracic region: Secondary | ICD-10-CM | POA: Diagnosis not present

## 2014-07-07 DIAGNOSIS — G629 Polyneuropathy, unspecified: Secondary | ICD-10-CM | POA: Diagnosis not present

## 2014-07-07 DIAGNOSIS — S23132A Subluxation of T5/T6 thoracic vertebra, initial encounter: Secondary | ICD-10-CM | POA: Diagnosis not present

## 2014-07-08 DIAGNOSIS — R634 Abnormal weight loss: Secondary | ICD-10-CM | POA: Diagnosis not present

## 2014-07-08 DIAGNOSIS — M4800 Spinal stenosis, site unspecified: Secondary | ICD-10-CM | POA: Diagnosis not present

## 2014-07-08 DIAGNOSIS — R21 Rash and other nonspecific skin eruption: Secondary | ICD-10-CM | POA: Diagnosis not present

## 2014-07-08 DIAGNOSIS — R739 Hyperglycemia, unspecified: Secondary | ICD-10-CM | POA: Diagnosis not present

## 2014-07-09 DIAGNOSIS — R21 Rash and other nonspecific skin eruption: Secondary | ICD-10-CM | POA: Diagnosis not present

## 2014-07-09 DIAGNOSIS — R739 Hyperglycemia, unspecified: Secondary | ICD-10-CM | POA: Diagnosis not present

## 2014-07-09 DIAGNOSIS — M4800 Spinal stenosis, site unspecified: Secondary | ICD-10-CM | POA: Diagnosis not present

## 2014-07-09 DIAGNOSIS — R634 Abnormal weight loss: Secondary | ICD-10-CM | POA: Diagnosis not present

## 2014-07-16 DIAGNOSIS — S23132A Subluxation of T5/T6 thoracic vertebra, initial encounter: Secondary | ICD-10-CM | POA: Diagnosis not present

## 2014-07-16 DIAGNOSIS — M9904 Segmental and somatic dysfunction of sacral region: Secondary | ICD-10-CM | POA: Diagnosis not present

## 2014-07-16 DIAGNOSIS — M9902 Segmental and somatic dysfunction of thoracic region: Secondary | ICD-10-CM | POA: Diagnosis not present

## 2014-07-16 DIAGNOSIS — S332XXA Dislocation of sacroiliac and sacrococcygeal joint, initial encounter: Secondary | ICD-10-CM | POA: Diagnosis not present

## 2014-07-23 ENCOUNTER — Other Ambulatory Visit (HOSPITAL_COMMUNITY): Payer: Self-pay | Admitting: Pulmonary Disease

## 2014-07-23 DIAGNOSIS — N403 Nodular prostate with lower urinary tract symptoms: Secondary | ICD-10-CM

## 2014-07-23 DIAGNOSIS — N138 Other obstructive and reflux uropathy: Secondary | ICD-10-CM

## 2014-07-23 DIAGNOSIS — R634 Abnormal weight loss: Secondary | ICD-10-CM

## 2014-07-28 ENCOUNTER — Ambulatory Visit (HOSPITAL_COMMUNITY): Payer: Medicare Other

## 2014-08-04 ENCOUNTER — Ambulatory Visit (HOSPITAL_COMMUNITY)
Admission: RE | Admit: 2014-08-04 | Discharge: 2014-08-04 | Disposition: A | Discharge status: Home / Self Care | Payer: Medicare Other | Source: Ambulatory Visit | Attending: Pulmonary Disease | Admitting: Pulmonary Disease

## 2014-08-04 DIAGNOSIS — K5730 Diverticulosis of large intestine without perforation or abscess without bleeding: Secondary | ICD-10-CM | POA: Diagnosis not present

## 2014-08-04 DIAGNOSIS — N403 Nodular prostate with lower urinary tract symptoms: Secondary | ICD-10-CM

## 2014-08-04 DIAGNOSIS — N400 Enlarged prostate without lower urinary tract symptoms: Secondary | ICD-10-CM | POA: Diagnosis not present

## 2014-08-04 DIAGNOSIS — R634 Abnormal weight loss: Secondary | ICD-10-CM | POA: Insufficient documentation

## 2014-08-04 DIAGNOSIS — N138 Other obstructive and reflux uropathy: Secondary | ICD-10-CM

## 2014-08-04 MED ORDER — IOHEXOL 300 MG/ML  SOLN
100.0000 mL | Freq: Once | INTRAMUSCULAR | Status: AC | PRN
  Administered 2014-08-04: 100 mL via INTRAVENOUS

## 2014-08-21 DIAGNOSIS — R634 Abnormal weight loss: Secondary | ICD-10-CM | POA: Diagnosis not present

## 2014-08-25 DIAGNOSIS — M9904 Segmental and somatic dysfunction of sacral region: Secondary | ICD-10-CM | POA: Diagnosis not present

## 2014-08-25 DIAGNOSIS — M9901 Segmental and somatic dysfunction of cervical region: Secondary | ICD-10-CM | POA: Diagnosis not present

## 2014-08-25 DIAGNOSIS — S13130A Subluxation of C2/C3 cervical vertebrae, initial encounter: Secondary | ICD-10-CM | POA: Diagnosis not present

## 2014-08-25 DIAGNOSIS — S332XXA Dislocation of sacroiliac and sacrococcygeal joint, initial encounter: Secondary | ICD-10-CM | POA: Diagnosis not present

## 2014-09-09 ENCOUNTER — Other Ambulatory Visit (HOSPITAL_COMMUNITY): Payer: Self-pay | Admitting: Pulmonary Disease

## 2014-09-09 DIAGNOSIS — R634 Abnormal weight loss: Secondary | ICD-10-CM

## 2014-09-09 DIAGNOSIS — M6281 Muscle weakness (generalized): Secondary | ICD-10-CM | POA: Diagnosis not present

## 2014-09-09 DIAGNOSIS — Z1231 Encounter for screening mammogram for malignant neoplasm of breast: Secondary | ICD-10-CM

## 2014-09-09 DIAGNOSIS — F4310 Post-traumatic stress disorder, unspecified: Secondary | ICD-10-CM | POA: Diagnosis not present

## 2014-09-09 DIAGNOSIS — G609 Hereditary and idiopathic neuropathy, unspecified: Secondary | ICD-10-CM | POA: Diagnosis not present

## 2014-09-14 ENCOUNTER — Ambulatory Visit (HOSPITAL_COMMUNITY): Admission: RE | Admit: 2014-09-14 | Payer: Medicare Other | Source: Ambulatory Visit

## 2014-09-17 DIAGNOSIS — M9904 Segmental and somatic dysfunction of sacral region: Secondary | ICD-10-CM | POA: Diagnosis not present

## 2014-09-17 DIAGNOSIS — S332XXA Dislocation of sacroiliac and sacrococcygeal joint, initial encounter: Secondary | ICD-10-CM | POA: Diagnosis not present

## 2014-09-17 DIAGNOSIS — M9901 Segmental and somatic dysfunction of cervical region: Secondary | ICD-10-CM | POA: Diagnosis not present

## 2014-09-17 DIAGNOSIS — S13130A Subluxation of C2/C3 cervical vertebrae, initial encounter: Secondary | ICD-10-CM | POA: Diagnosis not present

## 2014-09-19 DIAGNOSIS — M9901 Segmental and somatic dysfunction of cervical region: Secondary | ICD-10-CM | POA: Diagnosis not present

## 2014-09-19 DIAGNOSIS — S332XXA Dislocation of sacroiliac and sacrococcygeal joint, initial encounter: Secondary | ICD-10-CM | POA: Diagnosis not present

## 2014-09-19 DIAGNOSIS — M9904 Segmental and somatic dysfunction of sacral region: Secondary | ICD-10-CM | POA: Diagnosis not present

## 2014-09-19 DIAGNOSIS — S13130A Subluxation of C2/C3 cervical vertebrae, initial encounter: Secondary | ICD-10-CM | POA: Diagnosis not present

## 2014-09-28 DIAGNOSIS — S13160A Subluxation of C5/C6 cervical vertebrae, initial encounter: Secondary | ICD-10-CM | POA: Diagnosis not present

## 2014-09-28 DIAGNOSIS — M9901 Segmental and somatic dysfunction of cervical region: Secondary | ICD-10-CM | POA: Diagnosis not present

## 2014-10-14 DIAGNOSIS — M542 Cervicalgia: Secondary | ICD-10-CM | POA: Diagnosis not present

## 2014-10-14 DIAGNOSIS — Z6830 Body mass index (BMI) 30.0-30.9, adult: Secondary | ICD-10-CM | POA: Diagnosis not present

## 2014-10-14 DIAGNOSIS — M25511 Pain in right shoulder: Secondary | ICD-10-CM | POA: Diagnosis not present

## 2014-10-14 DIAGNOSIS — R972 Elevated prostate specific antigen [PSA]: Secondary | ICD-10-CM | POA: Diagnosis not present

## 2014-10-19 DIAGNOSIS — F4310 Post-traumatic stress disorder, unspecified: Secondary | ICD-10-CM | POA: Diagnosis not present

## 2014-10-19 DIAGNOSIS — N401 Enlarged prostate with lower urinary tract symptoms: Secondary | ICD-10-CM | POA: Diagnosis not present

## 2014-10-19 DIAGNOSIS — R634 Abnormal weight loss: Secondary | ICD-10-CM | POA: Diagnosis not present

## 2014-10-19 DIAGNOSIS — M4800 Spinal stenosis, site unspecified: Secondary | ICD-10-CM | POA: Diagnosis not present

## 2014-10-20 DIAGNOSIS — M4802 Spinal stenosis, cervical region: Secondary | ICD-10-CM | POA: Diagnosis not present

## 2014-10-20 DIAGNOSIS — M5021 Other cervical disc displacement,  high cervical region: Secondary | ICD-10-CM | POA: Diagnosis not present

## 2014-10-22 DIAGNOSIS — M542 Cervicalgia: Secondary | ICD-10-CM | POA: Diagnosis not present

## 2014-10-22 DIAGNOSIS — M25511 Pain in right shoulder: Secondary | ICD-10-CM | POA: Diagnosis not present

## 2014-10-22 DIAGNOSIS — Z6831 Body mass index (BMI) 31.0-31.9, adult: Secondary | ICD-10-CM | POA: Diagnosis not present

## 2014-11-17 DIAGNOSIS — M9901 Segmental and somatic dysfunction of cervical region: Secondary | ICD-10-CM | POA: Diagnosis not present

## 2014-11-17 DIAGNOSIS — S13160A Subluxation of C5/C6 cervical vertebrae, initial encounter: Secondary | ICD-10-CM | POA: Diagnosis not present

## 2014-11-27 DIAGNOSIS — M542 Cervicalgia: Secondary | ICD-10-CM | POA: Diagnosis not present

## 2014-11-27 DIAGNOSIS — Z6831 Body mass index (BMI) 31.0-31.9, adult: Secondary | ICD-10-CM | POA: Diagnosis not present

## 2014-11-27 DIAGNOSIS — M25511 Pain in right shoulder: Secondary | ICD-10-CM | POA: Diagnosis not present

## 2014-12-03 DIAGNOSIS — S13160A Subluxation of C5/C6 cervical vertebrae, initial encounter: Secondary | ICD-10-CM | POA: Diagnosis not present

## 2014-12-03 DIAGNOSIS — M9901 Segmental and somatic dysfunction of cervical region: Secondary | ICD-10-CM | POA: Diagnosis not present

## 2014-12-24 DIAGNOSIS — S33140A Subluxation of L4/L5 lumbar vertebra, initial encounter: Secondary | ICD-10-CM | POA: Diagnosis not present

## 2014-12-24 DIAGNOSIS — S13160A Subluxation of C5/C6 cervical vertebrae, initial encounter: Secondary | ICD-10-CM | POA: Diagnosis not present

## 2014-12-24 DIAGNOSIS — M9901 Segmental and somatic dysfunction of cervical region: Secondary | ICD-10-CM | POA: Diagnosis not present

## 2014-12-24 DIAGNOSIS — M9903 Segmental and somatic dysfunction of lumbar region: Secondary | ICD-10-CM | POA: Diagnosis not present

## 2015-01-05 DIAGNOSIS — Z79899 Other long term (current) drug therapy: Secondary | ICD-10-CM | POA: Diagnosis not present

## 2015-01-05 DIAGNOSIS — G629 Polyneuropathy, unspecified: Secondary | ICD-10-CM | POA: Diagnosis not present

## 2015-01-05 DIAGNOSIS — M79606 Pain in leg, unspecified: Secondary | ICD-10-CM | POA: Diagnosis not present

## 2015-01-05 DIAGNOSIS — G894 Chronic pain syndrome: Secondary | ICD-10-CM | POA: Diagnosis not present

## 2015-01-06 DIAGNOSIS — Z23 Encounter for immunization: Secondary | ICD-10-CM | POA: Diagnosis not present

## 2015-01-06 DIAGNOSIS — N401 Enlarged prostate with lower urinary tract symptoms: Secondary | ICD-10-CM | POA: Diagnosis not present

## 2015-01-06 DIAGNOSIS — G809 Cerebral palsy, unspecified: Secondary | ICD-10-CM | POA: Diagnosis not present

## 2015-01-06 DIAGNOSIS — F4310 Post-traumatic stress disorder, unspecified: Secondary | ICD-10-CM | POA: Diagnosis not present

## 2015-01-06 DIAGNOSIS — M4800 Spinal stenosis, site unspecified: Secondary | ICD-10-CM | POA: Diagnosis not present

## 2015-01-22 DIAGNOSIS — M542 Cervicalgia: Secondary | ICD-10-CM | POA: Diagnosis not present

## 2015-01-22 DIAGNOSIS — Z6831 Body mass index (BMI) 31.0-31.9, adult: Secondary | ICD-10-CM | POA: Diagnosis not present

## 2015-01-22 DIAGNOSIS — M25511 Pain in right shoulder: Secondary | ICD-10-CM | POA: Diagnosis not present

## 2015-02-01 DIAGNOSIS — S33140A Subluxation of L4/L5 lumbar vertebra, initial encounter: Secondary | ICD-10-CM | POA: Diagnosis not present

## 2015-02-01 DIAGNOSIS — M9901 Segmental and somatic dysfunction of cervical region: Secondary | ICD-10-CM | POA: Diagnosis not present

## 2015-02-01 DIAGNOSIS — M9903 Segmental and somatic dysfunction of lumbar region: Secondary | ICD-10-CM | POA: Diagnosis not present

## 2015-02-01 DIAGNOSIS — S13160A Subluxation of C5/C6 cervical vertebrae, initial encounter: Secondary | ICD-10-CM | POA: Diagnosis not present

## 2015-02-25 DIAGNOSIS — S13160A Subluxation of C5/C6 cervical vertebrae, initial encounter: Secondary | ICD-10-CM | POA: Diagnosis not present

## 2015-02-25 DIAGNOSIS — S332XXA Dislocation of sacroiliac and sacrococcygeal joint, initial encounter: Secondary | ICD-10-CM | POA: Diagnosis not present

## 2015-02-25 DIAGNOSIS — M9904 Segmental and somatic dysfunction of sacral region: Secondary | ICD-10-CM | POA: Diagnosis not present

## 2015-02-25 DIAGNOSIS — M9901 Segmental and somatic dysfunction of cervical region: Secondary | ICD-10-CM | POA: Diagnosis not present

## 2015-04-09 DIAGNOSIS — R351 Nocturia: Secondary | ICD-10-CM | POA: Diagnosis not present

## 2015-04-16 DIAGNOSIS — R319 Hematuria, unspecified: Secondary | ICD-10-CM | POA: Diagnosis not present

## 2015-04-16 DIAGNOSIS — N3943 Post-void dribbling: Secondary | ICD-10-CM | POA: Diagnosis not present

## 2015-04-16 DIAGNOSIS — R351 Nocturia: Secondary | ICD-10-CM | POA: Diagnosis not present

## 2015-04-16 DIAGNOSIS — R972 Elevated prostate specific antigen [PSA]: Secondary | ICD-10-CM | POA: Diagnosis not present

## 2015-04-19 DIAGNOSIS — R300 Dysuria: Secondary | ICD-10-CM | POA: Diagnosis not present

## 2015-06-18 DIAGNOSIS — M9904 Segmental and somatic dysfunction of sacral region: Secondary | ICD-10-CM | POA: Diagnosis not present

## 2015-06-18 DIAGNOSIS — S13160A Subluxation of C5/C6 cervical vertebrae, initial encounter: Secondary | ICD-10-CM | POA: Diagnosis not present

## 2015-06-18 DIAGNOSIS — S332XXA Dislocation of sacroiliac and sacrococcygeal joint, initial encounter: Secondary | ICD-10-CM | POA: Diagnosis not present

## 2015-06-18 DIAGNOSIS — M9901 Segmental and somatic dysfunction of cervical region: Secondary | ICD-10-CM | POA: Diagnosis not present

## 2015-07-05 DIAGNOSIS — M9901 Segmental and somatic dysfunction of cervical region: Secondary | ICD-10-CM | POA: Diagnosis not present

## 2015-07-05 DIAGNOSIS — S332XXA Dislocation of sacroiliac and sacrococcygeal joint, initial encounter: Secondary | ICD-10-CM | POA: Diagnosis not present

## 2015-07-05 DIAGNOSIS — G629 Polyneuropathy, unspecified: Secondary | ICD-10-CM | POA: Diagnosis not present

## 2015-07-05 DIAGNOSIS — F419 Anxiety disorder, unspecified: Secondary | ICD-10-CM | POA: Diagnosis not present

## 2015-07-05 DIAGNOSIS — M9904 Segmental and somatic dysfunction of sacral region: Secondary | ICD-10-CM | POA: Diagnosis not present

## 2015-07-05 DIAGNOSIS — E785 Hyperlipidemia, unspecified: Secondary | ICD-10-CM | POA: Diagnosis not present

## 2015-07-05 DIAGNOSIS — S13160A Subluxation of C5/C6 cervical vertebrae, initial encounter: Secondary | ICD-10-CM | POA: Diagnosis not present

## 2015-07-05 DIAGNOSIS — Z79899 Other long term (current) drug therapy: Secondary | ICD-10-CM | POA: Diagnosis not present

## 2015-07-05 DIAGNOSIS — M79606 Pain in leg, unspecified: Secondary | ICD-10-CM | POA: Diagnosis not present

## 2015-07-07 DIAGNOSIS — R21 Rash and other nonspecific skin eruption: Secondary | ICD-10-CM | POA: Diagnosis not present

## 2015-07-07 DIAGNOSIS — F4310 Post-traumatic stress disorder, unspecified: Secondary | ICD-10-CM | POA: Diagnosis not present

## 2015-07-07 DIAGNOSIS — N401 Enlarged prostate with lower urinary tract symptoms: Secondary | ICD-10-CM | POA: Diagnosis not present

## 2015-07-07 DIAGNOSIS — M4800 Spinal stenosis, site unspecified: Secondary | ICD-10-CM | POA: Diagnosis not present

## 2015-07-19 DIAGNOSIS — Z1283 Encounter for screening for malignant neoplasm of skin: Secondary | ICD-10-CM | POA: Diagnosis not present

## 2015-07-19 DIAGNOSIS — L570 Actinic keratosis: Secondary | ICD-10-CM | POA: Diagnosis not present

## 2015-07-19 DIAGNOSIS — X32XXXD Exposure to sunlight, subsequent encounter: Secondary | ICD-10-CM | POA: Diagnosis not present

## 2015-07-19 DIAGNOSIS — D225 Melanocytic nevi of trunk: Secondary | ICD-10-CM | POA: Diagnosis not present

## 2015-08-03 DIAGNOSIS — S332XXA Dislocation of sacroiliac and sacrococcygeal joint, initial encounter: Secondary | ICD-10-CM | POA: Diagnosis not present

## 2015-08-03 DIAGNOSIS — S13160A Subluxation of C5/C6 cervical vertebrae, initial encounter: Secondary | ICD-10-CM | POA: Diagnosis not present

## 2015-08-03 DIAGNOSIS — M9901 Segmental and somatic dysfunction of cervical region: Secondary | ICD-10-CM | POA: Diagnosis not present

## 2015-08-03 DIAGNOSIS — M9904 Segmental and somatic dysfunction of sacral region: Secondary | ICD-10-CM | POA: Diagnosis not present

## 2015-08-19 DIAGNOSIS — M9902 Segmental and somatic dysfunction of thoracic region: Secondary | ICD-10-CM | POA: Diagnosis not present

## 2015-08-19 DIAGNOSIS — M9904 Segmental and somatic dysfunction of sacral region: Secondary | ICD-10-CM | POA: Diagnosis not present

## 2015-08-19 DIAGNOSIS — S23152A Subluxation of T9/T10 thoracic vertebra, initial encounter: Secondary | ICD-10-CM | POA: Diagnosis not present

## 2015-08-19 DIAGNOSIS — S332XXA Dislocation of sacroiliac and sacrococcygeal joint, initial encounter: Secondary | ICD-10-CM | POA: Diagnosis not present

## 2015-10-05 DIAGNOSIS — R972 Elevated prostate specific antigen [PSA]: Secondary | ICD-10-CM | POA: Diagnosis not present

## 2015-10-11 DIAGNOSIS — M9901 Segmental and somatic dysfunction of cervical region: Secondary | ICD-10-CM | POA: Diagnosis not present

## 2015-10-11 DIAGNOSIS — S13160A Subluxation of C5/C6 cervical vertebrae, initial encounter: Secondary | ICD-10-CM | POA: Diagnosis not present

## 2015-10-18 DIAGNOSIS — R350 Frequency of micturition: Secondary | ICD-10-CM | POA: Diagnosis not present

## 2015-10-18 DIAGNOSIS — M9901 Segmental and somatic dysfunction of cervical region: Secondary | ICD-10-CM | POA: Diagnosis not present

## 2015-10-18 DIAGNOSIS — S13160A Subluxation of C5/C6 cervical vertebrae, initial encounter: Secondary | ICD-10-CM | POA: Diagnosis not present

## 2015-10-18 DIAGNOSIS — S332XXA Dislocation of sacroiliac and sacrococcygeal joint, initial encounter: Secondary | ICD-10-CM | POA: Diagnosis not present

## 2015-10-18 DIAGNOSIS — R351 Nocturia: Secondary | ICD-10-CM | POA: Diagnosis not present

## 2015-10-18 DIAGNOSIS — R319 Hematuria, unspecified: Secondary | ICD-10-CM | POA: Diagnosis not present

## 2015-10-18 DIAGNOSIS — M9904 Segmental and somatic dysfunction of sacral region: Secondary | ICD-10-CM | POA: Diagnosis not present

## 2015-10-18 DIAGNOSIS — R972 Elevated prostate specific antigen [PSA]: Secondary | ICD-10-CM | POA: Diagnosis not present

## 2015-12-13 DIAGNOSIS — M9904 Segmental and somatic dysfunction of sacral region: Secondary | ICD-10-CM | POA: Diagnosis not present

## 2015-12-13 DIAGNOSIS — S332XXA Dislocation of sacroiliac and sacrococcygeal joint, initial encounter: Secondary | ICD-10-CM | POA: Diagnosis not present

## 2015-12-13 DIAGNOSIS — S13160A Subluxation of C5/C6 cervical vertebrae, initial encounter: Secondary | ICD-10-CM | POA: Diagnosis not present

## 2015-12-13 DIAGNOSIS — M9901 Segmental and somatic dysfunction of cervical region: Secondary | ICD-10-CM | POA: Diagnosis not present

## 2015-12-20 DIAGNOSIS — M9904 Segmental and somatic dysfunction of sacral region: Secondary | ICD-10-CM | POA: Diagnosis not present

## 2015-12-20 DIAGNOSIS — S13160A Subluxation of C5/C6 cervical vertebrae, initial encounter: Secondary | ICD-10-CM | POA: Diagnosis not present

## 2015-12-20 DIAGNOSIS — M9901 Segmental and somatic dysfunction of cervical region: Secondary | ICD-10-CM | POA: Diagnosis not present

## 2015-12-20 DIAGNOSIS — S332XXA Dislocation of sacroiliac and sacrococcygeal joint, initial encounter: Secondary | ICD-10-CM | POA: Diagnosis not present

## 2015-12-29 ENCOUNTER — Ambulatory Visit (INDEPENDENT_AMBULATORY_CARE_PROVIDER_SITE_OTHER): Payer: Medicare Other

## 2015-12-29 ENCOUNTER — Ambulatory Visit (INDEPENDENT_AMBULATORY_CARE_PROVIDER_SITE_OTHER): Payer: Medicare Other | Admitting: Orthopaedic Surgery

## 2015-12-29 ENCOUNTER — Encounter: Payer: Self-pay | Admitting: Orthopaedic Surgery

## 2015-12-29 VITALS — BP 132/80 | HR 51 | Temp 97.7°F | Ht 72.0 in | Wt 244.0 lb

## 2015-12-29 DIAGNOSIS — M9901 Segmental and somatic dysfunction of cervical region: Secondary | ICD-10-CM | POA: Diagnosis not present

## 2015-12-29 DIAGNOSIS — M542 Cervicalgia: Secondary | ICD-10-CM | POA: Diagnosis not present

## 2015-12-29 DIAGNOSIS — M5090 Cervical disc disorder, unspecified, unspecified cervical region: Secondary | ICD-10-CM | POA: Diagnosis not present

## 2015-12-29 DIAGNOSIS — S332XXA Dislocation of sacroiliac and sacrococcygeal joint, initial encounter: Secondary | ICD-10-CM | POA: Diagnosis not present

## 2015-12-29 DIAGNOSIS — S13160A Subluxation of C5/C6 cervical vertebrae, initial encounter: Secondary | ICD-10-CM | POA: Diagnosis not present

## 2015-12-29 DIAGNOSIS — M9904 Segmental and somatic dysfunction of sacral region: Secondary | ICD-10-CM | POA: Diagnosis not present

## 2015-12-29 MED ORDER — PREDNISONE 5 MG (21) PO TBPK: ORAL_TABLET | ORAL | 0 refills | Status: DC

## 2015-12-29 MED ORDER — HYDROCODONE-ACETAMINOPHEN 5-325 MG PO TABS: 1.0000 | ORAL_TABLET | ORAL | 0 refills | Status: DC | PRN

## 2015-12-29 MED ORDER — HYDROCODONE-ACETAMINOPHEN 7.5-325 MG PO TABS: ORAL_TABLET | ORAL | 0 refills | Status: DC

## 2015-12-29 NOTE — Progress Notes (Signed)
Patient Norman Blake, male DOB:1940/12/01, 75 y.o. CL:[id number]  Chief Complaint  Patient presents with  . New Patient (Initial Visit)    Right shoulder pain    HPI  Norman Blake is a 75 y.o. male who has shoulder pain on the right.  He has numbness of the right thumb and index finger at times. He has pain running down the right arm and pain in the right upper trapezius.  This has been going on about a month to six weeks getting worse.  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  He has tried ice, heat, rest, Advil with little help.  He has no trauma.  He has full motion of the right shoulder. HPI  Body mass index is 33.09 kg/m.  ROS  Review of Systems  HENT: Negative for congestion.   Respiratory: Negative for cough and shortness of breath.   Cardiovascular: Negative for chest pain and leg swelling.  Endocrine: Positive for cold intolerance.  Musculoskeletal: Positive for arthralgias and neck pain.  Allergic/Immunologic: Positive for environmental allergies.    Past Medical History:  Diagnosis Date  . Depression   . History of hiatal hernia   . 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 CHOLECYSTECTOMY    . COLONOSCOPY N/A 06/09/2014   Procedure: COLONOSCOPY;  Surgeon: Aviva Signs Md, MD;  Location: [facility];  Service: Gastroenterology;  Laterality: N/A;    Family History  Problem Relation Age of Onset  . Breast cancer Mother   . Cancer Father     Social History Social History  Substance Use Topics  . Smoking status: Former Smoker    Packs/day: 0.50    Years: 3.00    Quit date: 06/09/1958  . Smokeless tobacco: Never Used  . Alcohol use No    No Known Allergies  Current Outpatient Prescriptions  Medication Sig Dispense Refill  . DULoxetine (CYMBALTA) 60 MG capsule Take 60 mg by mouth daily.    . finasteride (PROSCAR) 5 MG tablet Take  5 mg by mouth at bedtime.    Marland Kitchen latanoprost (XALATAN) 0.005 % ophthalmic solution Place 1 drop into both eyes at bedtime.    . Multiple Vitamin (MULTIVITAMIN) tablet Take 1 tablet by mouth daily.    . pregabalin (LYRICA) 50 MG capsule Take 50 mg by mouth 3 (three) times daily.     . simvastatin (ZOCOR) 80 MG tablet Take 40 mg by mouth at bedtime.    . tamsulosin (FLOMAX) 0.4 MG CAPS capsule Take 0.4 mg by mouth daily.    . timolol (TIMOPTIC-XR) 0.5 % ophthalmic gel-forming Place 1 drop into both eyes daily.    . traMADol (ULTRAM) 50 MG tablet Take 50 mg by mouth 4 (four) times daily.    . traZODone (DESYREL) 150 MG tablet Take 150 mg by mouth at bedtime.    Marland Kitchen HYDROcodone-acetaminophen (NORCO) 7.5-325 MG tablet One every four hours for pain as needed.  Do not drive car or operate machinery while taking this medicine.  Must last 14 days. 56 tablet 0  . predniSONE (STERAPRED UNI-PAK 21 TAB) 5 MG (21) TBPK tablet Take 6 pills first day; 5 pills second day; 4 pills third day; 3 pills fourth day; 2 pills next day and 1 pill last day. 21 tablet 0   No current facility-administered medications for this visit.      Physical Exam  Blood  pressure 132/80, pulse (!) 51, temperature 97.7 F (36.5 C), height 6' (1.829 m), weight 244 lb (110.7 kg).  Constitutional: overall normal hygiene, normal nutrition, well developed, normal grooming, normal body habitus. Assistive device:none  Musculoskeletal: gait and station Limp none, muscle tone and strength are normal, no tremors or atrophy is present.  .  Neurological: coordination overall normal.  Deep tendon reflex/nerve stretch intact.  Sensation normal.  Cranial nerves II-XII intact.   Skin:   Normal overall no scars, lesions, ulcers or rashes. No psoriasis.  Psychiatric: Alert and oriented x 3.  Recent memory intact, remote memory unclear.  Normal mood and affect. Well groomed.  Good eye contact.  Cardiovascular: overall no swelling, no varicosities,  no edema bilaterally, normal temperatures of the legs and arms, no clubbing, cyanosis and good capillary refill.  Lymphatic: palpation is normal.  He has full motion of the right shoulder with no pain, no crepitus.  He has full motion of the neck with no spasm.  He has decreased sensation of the right dorsal thumb and index finger.  Grip is slightly weak on the right.  The patient has been educated about the nature of the problem(s) and counseled on treatment options.  The patient appeared to understand what I have discussed and is in agreement with it.  Encounter Diagnoses  Name Primary?  . Neck pain Yes  . Cervical disc disease     PLAN Call if any problems.  Precautions discussed.  Continue current medications.   Return to clinic after MRI of the neck open unit   I have given Prednisone dose pack, and pain medicine.  Electronically Signed Sanjuana Kava, MD 10/11/201711:29 AM

## 2015-12-30 ENCOUNTER — Other Ambulatory Visit: Payer: Self-pay | Admitting: Orthopaedic Surgery

## 2015-12-30 DIAGNOSIS — T1590XA Foreign body on external eye, part unspecified, unspecified eye, initial encounter: Secondary | ICD-10-CM

## 2016-01-03 DIAGNOSIS — M79606 Pain in leg, unspecified: Secondary | ICD-10-CM | POA: Diagnosis not present

## 2016-01-03 DIAGNOSIS — G629 Polyneuropathy, unspecified: Secondary | ICD-10-CM | POA: Diagnosis not present

## 2016-01-03 DIAGNOSIS — Z79899 Other long term (current) drug therapy: Secondary | ICD-10-CM | POA: Diagnosis not present

## 2016-01-03 DIAGNOSIS — F419 Anxiety disorder, unspecified: Secondary | ICD-10-CM | POA: Diagnosis not present

## 2016-01-03 DIAGNOSIS — E785 Hyperlipidemia, unspecified: Secondary | ICD-10-CM | POA: Diagnosis not present

## 2016-01-06 DIAGNOSIS — M542 Cervicalgia: Secondary | ICD-10-CM | POA: Diagnosis not present

## 2016-01-06 DIAGNOSIS — N401 Enlarged prostate with lower urinary tract symptoms: Secondary | ICD-10-CM | POA: Diagnosis not present

## 2016-01-06 DIAGNOSIS — F4310 Post-traumatic stress disorder, unspecified: Secondary | ICD-10-CM | POA: Diagnosis not present

## 2016-01-06 DIAGNOSIS — M4800 Spinal stenosis, site unspecified: Secondary | ICD-10-CM | POA: Diagnosis not present

## 2016-01-10 ENCOUNTER — Ambulatory Visit
Admission: RE | Admit: 2016-01-10 | Discharge: 2016-01-10 | Disposition: A | Discharge status: Home / Self Care | Payer: Medicare Other | Source: Ambulatory Visit | Attending: Orthopaedic Surgery | Admitting: Orthopaedic Surgery

## 2016-01-10 DIAGNOSIS — M542 Cervicalgia: Secondary | ICD-10-CM

## 2016-01-10 DIAGNOSIS — T1590XA Foreign body on external eye, part unspecified, unspecified eye, initial encounter: Secondary | ICD-10-CM

## 2016-01-10 DIAGNOSIS — Z01818 Encounter for other preprocedural examination: Secondary | ICD-10-CM | POA: Diagnosis not present

## 2016-01-10 DIAGNOSIS — M50222 Other cervical disc displacement at C5-C6 level: Secondary | ICD-10-CM | POA: Diagnosis not present

## 2016-01-12 ENCOUNTER — Ambulatory Visit (INDEPENDENT_AMBULATORY_CARE_PROVIDER_SITE_OTHER): Payer: Medicare Other | Admitting: Orthopaedic Surgery

## 2016-01-12 VITALS — BP 134/70 | HR 56 | Temp 97.7°F | Ht 72.0 in | Wt 244.0 lb

## 2016-01-12 DIAGNOSIS — M542 Cervicalgia: Secondary | ICD-10-CM

## 2016-01-12 DIAGNOSIS — M5090 Cervical disc disorder, unspecified, unspecified cervical region: Secondary | ICD-10-CM

## 2016-01-12 NOTE — Progress Notes (Signed)
[name], male DOB:05-02-1940, 75 y.o. [id number]  Chief Complaint  Patient presents with  . Follow-up    neck pain    HPI  Norman Blake is a 75 y.o. male who has pain of the right trapezius area and neck.  He had a MRI of the neck showing: IMPRESSION: 1. At C5-6 there is a small shallow central disc protrusion and mild bilateral foraminal narrowing. 2. At C6-7 there is mild left foraminal narrowing.  He has had less pain the last week.  When the pain happens it is very sharp and lasts about a minute or two.  It is localized.  I will set up PT for him in [place].  He is agreeable for this.  HPI  Body mass index is 33.09 kg/m.  ROS  Review of Systems  HENT: Negative for congestion.   Respiratory: Negative for cough and shortness of breath.   Cardiovascular: Negative for chest pain and leg swelling.  Endocrine: Positive for cold intolerance.  Musculoskeletal: Positive for arthralgias and neck pain.  Allergic/Immunologic: Positive for environmental allergies.    Past Medical History:  Diagnosis Date  . Depression   . History of hiatal hernia   . 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 CHOLECYSTECTOMY    . COLONOSCOPY N/A 06/09/2014   Procedure: COLONOSCOPY;  Surgeon: Aviva Signs Md, MD;  Location: [facility];  Service: Gastroenterology;  Laterality: N/A;    Family History  Problem Relation Age of Onset  . Breast cancer Mother   . Cancer Father     Social History Social History  Substance Use Topics  . Smoking status: Former Smoker    Packs/day: 0.50    Years: 3.00    Quit date: 06/09/1958  . Smokeless tobacco: Never Used  . Alcohol use No    No Known Allergies  Current Outpatient Prescriptions  Medication Sig Dispense Refill  . DULoxetine (CYMBALTA) 60 MG capsule Take 60 mg by mouth daily.    . finasteride (PROSCAR) 5  MG tablet Take 5 mg by mouth at bedtime.    Marland Kitchen HYDROcodone-acetaminophen (NORCO) 7.5-325 MG tablet One every four hours for pain as needed.  Do not drive car or operate machinery while taking this medicine.  Must last 14 days. 56 tablet 0  . latanoprost (XALATAN) 0.005 % ophthalmic solution Place 1 drop into both eyes at bedtime.    . Multiple Vitamin (MULTIVITAMIN) tablet Take 1 tablet by mouth daily.    . predniSONE (STERAPRED UNI-PAK 21 TAB) 5 MG (21) TBPK tablet Take 6 pills first day; 5 pills second day; 4 pills third day; 3 pills fourth day; 2 pills next day and 1 pill last day. 21 tablet 0  . pregabalin (LYRICA) 50 MG capsule Take 50 mg by mouth 3 (three) times daily.     . simvastatin (ZOCOR) 80 MG tablet Take 40 mg by mouth at bedtime.    . tamsulosin (FLOMAX) 0.4 MG CAPS capsule Take 0.4 mg by mouth daily.    . timolol (TIMOPTIC-XR) 0.5 % ophthalmic gel-forming Place 1 drop into both eyes daily.    . traMADol (ULTRAM) 50 MG tablet Take 50 mg by mouth 4 (four) times daily.    . traZODone (DESYREL) 150 MG tablet Take 150 mg by mouth at bedtime.     No current facility-administered medications for this visit.  Physical Exam  Blood pressure 134/70, pulse (!) 56, temperature 97.7 F (36.5 C), height 6' (1.829 m), weight 244 lb (110.7 kg).  Constitutional: overall normal hygiene, normal nutrition, well developed, normal grooming, normal body habitus. Assistive device:none  Musculoskeletal: gait and station Limp none, muscle tone and strength are normal, no tremors or atrophy is present.  .  Neurological: coordination overall normal.  Deep tendon reflex/nerve stretch intact.  Sensation normal.  Cranial nerves II-XII intact.   Skin:   Normal overall no scars, lesions, ulcers or rashes. No psoriasis.  Psychiatric: Alert and oriented x 3.  Recent memory intact, remote memory unclear.  Normal mood and affect. Well groomed.  Good eye contact.  Cardiovascular: overall no swelling, no  varicosities, no edema bilaterally, normal temperatures of the legs and arms, no clubbing, cyanosis and good capillary refill.  Lymphatic: palpation is normal.  He has full motion of his neck.  He has some tenderness of the right upper trapezius but no swelling, no spasm.  He has full motion of the right shoulder.  The patient has been educated about the nature of the problem(s) and counseled on treatment options.  The patient appeared to understand what I have discussed and is in agreement with it.  Encounter Diagnoses  Name Primary?  . Neck pain Yes  . Cervical disc disease     PLAN Call if any problems.  Precautions discussed.  Continue current medications.   Return to clinic 3 weeks   [place].  Electronically Signed Sanjuana Kava, MD 10/25/20178:39 AM

## 2016-01-14 DIAGNOSIS — M5022 Other cervical disc displacement, mid-cervical region, unspecified level: Secondary | ICD-10-CM | POA: Diagnosis not present

## 2016-01-14 DIAGNOSIS — M542 Cervicalgia: Secondary | ICD-10-CM | POA: Diagnosis not present

## 2016-01-14 DIAGNOSIS — M79601 Pain in right arm: Secondary | ICD-10-CM | POA: Diagnosis not present

## 2016-01-18 DIAGNOSIS — M542 Cervicalgia: Secondary | ICD-10-CM | POA: Diagnosis not present

## 2016-01-18 DIAGNOSIS — M79601 Pain in right arm: Secondary | ICD-10-CM | POA: Diagnosis not present

## 2016-01-18 DIAGNOSIS — M5022 Other cervical disc displacement, mid-cervical region, unspecified level: Secondary | ICD-10-CM | POA: Diagnosis not present

## 2016-01-19 DIAGNOSIS — M542 Cervicalgia: Secondary | ICD-10-CM | POA: Diagnosis not present

## 2016-01-19 DIAGNOSIS — M79601 Pain in right arm: Secondary | ICD-10-CM | POA: Diagnosis not present

## 2016-01-19 DIAGNOSIS — M5022 Other cervical disc displacement, mid-cervical region, unspecified level: Secondary | ICD-10-CM | POA: Diagnosis not present

## 2016-01-24 DIAGNOSIS — M542 Cervicalgia: Secondary | ICD-10-CM | POA: Diagnosis not present

## 2016-01-24 DIAGNOSIS — M5022 Other cervical disc displacement, mid-cervical region, unspecified level: Secondary | ICD-10-CM | POA: Diagnosis not present

## 2016-01-24 DIAGNOSIS — M79601 Pain in right arm: Secondary | ICD-10-CM | POA: Diagnosis not present

## 2016-01-26 DIAGNOSIS — M5022 Other cervical disc displacement, mid-cervical region, unspecified level: Secondary | ICD-10-CM | POA: Diagnosis not present

## 2016-01-26 DIAGNOSIS — M542 Cervicalgia: Secondary | ICD-10-CM | POA: Diagnosis not present

## 2016-01-26 DIAGNOSIS — M79601 Pain in right arm: Secondary | ICD-10-CM | POA: Diagnosis not present

## 2016-01-28 DIAGNOSIS — M542 Cervicalgia: Secondary | ICD-10-CM | POA: Diagnosis not present

## 2016-01-28 DIAGNOSIS — S13160A Subluxation of C5/C6 cervical vertebrae, initial encounter: Secondary | ICD-10-CM | POA: Diagnosis not present

## 2016-01-28 DIAGNOSIS — M9904 Segmental and somatic dysfunction of sacral region: Secondary | ICD-10-CM | POA: Diagnosis not present

## 2016-01-28 DIAGNOSIS — M79601 Pain in right arm: Secondary | ICD-10-CM | POA: Diagnosis not present

## 2016-01-28 DIAGNOSIS — S332XXA Dislocation of sacroiliac and sacrococcygeal joint, initial encounter: Secondary | ICD-10-CM | POA: Diagnosis not present

## 2016-01-28 DIAGNOSIS — M9901 Segmental and somatic dysfunction of cervical region: Secondary | ICD-10-CM | POA: Diagnosis not present

## 2016-01-28 DIAGNOSIS — M5022 Other cervical disc displacement, mid-cervical region, unspecified level: Secondary | ICD-10-CM | POA: Diagnosis not present

## 2016-01-31 DIAGNOSIS — M5022 Other cervical disc displacement, mid-cervical region, unspecified level: Secondary | ICD-10-CM | POA: Diagnosis not present

## 2016-01-31 DIAGNOSIS — M79601 Pain in right arm: Secondary | ICD-10-CM | POA: Diagnosis not present

## 2016-01-31 DIAGNOSIS — M542 Cervicalgia: Secondary | ICD-10-CM | POA: Diagnosis not present

## 2016-02-02 DIAGNOSIS — M542 Cervicalgia: Secondary | ICD-10-CM | POA: Diagnosis not present

## 2016-02-02 DIAGNOSIS — M5022 Other cervical disc displacement, mid-cervical region, unspecified level: Secondary | ICD-10-CM | POA: Diagnosis not present

## 2016-02-02 DIAGNOSIS — M79601 Pain in right arm: Secondary | ICD-10-CM | POA: Diagnosis not present

## 2016-02-04 DIAGNOSIS — M5022 Other cervical disc displacement, mid-cervical region, unspecified level: Secondary | ICD-10-CM | POA: Diagnosis not present

## 2016-02-04 DIAGNOSIS — M542 Cervicalgia: Secondary | ICD-10-CM | POA: Diagnosis not present

## 2016-02-04 DIAGNOSIS — M79601 Pain in right arm: Secondary | ICD-10-CM | POA: Diagnosis not present

## 2016-02-07 DIAGNOSIS — M5022 Other cervical disc displacement, mid-cervical region, unspecified level: Secondary | ICD-10-CM | POA: Diagnosis not present

## 2016-02-07 DIAGNOSIS — M542 Cervicalgia: Secondary | ICD-10-CM | POA: Diagnosis not present

## 2016-02-07 DIAGNOSIS — M79601 Pain in right arm: Secondary | ICD-10-CM | POA: Diagnosis not present

## 2016-02-08 ENCOUNTER — Ambulatory Visit (INDEPENDENT_AMBULATORY_CARE_PROVIDER_SITE_OTHER): Payer: Medicare Other | Admitting: Orthopaedic Surgery

## 2016-02-08 ENCOUNTER — Encounter: Payer: Self-pay | Admitting: Orthopaedic Surgery

## 2016-02-08 VITALS — BP 108/63 | HR 48 | Temp 97.9°F | Ht 73.0 in | Wt 245.0 lb

## 2016-02-08 DIAGNOSIS — M542 Cervicalgia: Secondary | ICD-10-CM | POA: Diagnosis not present

## 2016-02-08 DIAGNOSIS — M5090 Cervical disc disorder, unspecified, unspecified cervical region: Secondary | ICD-10-CM

## 2016-02-08 MED ORDER — HYDROCODONE-ACETAMINOPHEN 7.5-325 MG PO TABS: ORAL_TABLET | ORAL | 0 refills | Status: DC

## 2016-02-08 NOTE — Progress Notes (Signed)
[name], male DOB:06/16/1940, 75 y.o. [id number]  Chief Complaint  Patient presents with  . Follow-up    neck pain    HPI  Norman Blake is a 75 y.o. male who has neck pain and right upper trapezius pain.  He has been going to PT in [place] and is making good progress.  He says he is 50% better.  He has less pain and less discomfort.  He has no paresthesias.  I want him to continue the PT. HPI  Body mass index is 32.32 kg/m.  ROS  Review of Systems  Past Medical History:  Diagnosis Date  . Depression   . History of hiatal hernia   . 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 CHOLECYSTECTOMY    . COLONOSCOPY N/A 06/09/2014   Procedure: COLONOSCOPY;  Surgeon: Aviva Signs Md, MD;  Location: [facility];  Service: Gastroenterology;  Laterality: N/A;    Family History  Problem Relation Age of Onset  . Breast cancer Mother   . Cancer Father     Social History Social History  Substance Use Topics  . Smoking status: Former Smoker    Packs/day: 0.50    Years: 3.00    Quit date: 06/09/1958  . Smokeless tobacco: Never Used  . Alcohol use No    No Known Allergies  Current Outpatient Prescriptions  Medication Sig Dispense Refill  . DULoxetine (CYMBALTA) 60 MG capsule Take 60 mg by mouth daily.    . finasteride (PROSCAR) 5 MG tablet Take 5 mg by mouth at bedtime.    Marland Kitchen HYDROcodone-acetaminophen (NORCO) 7.5-325 MG tablet One every four hours for pain as needed.  Do not drive car or operate machinery while taking this medicine.  Must last 14 days. 56 tablet 0  . latanoprost (XALATAN) 0.005 % ophthalmic solution Place 1 drop into both eyes at bedtime.    . Multiple Vitamin (MULTIVITAMIN) tablet Take 1 tablet by mouth daily.    . predniSONE (STERAPRED UNI-PAK 21 TAB) 5 MG (21) TBPK tablet Take 6 pills first day; 5 pills second day; 4 pills third day;  3 pills fourth day; 2 pills next day and 1 pill last day. 21 tablet 0  . pregabalin (LYRICA) 50 MG capsule Take 50 mg by mouth 3 (three) times daily.     . simvastatin (ZOCOR) 80 MG tablet Take 40 mg by mouth at bedtime.    . tamsulosin (FLOMAX) 0.4 MG CAPS capsule Take 0.4 mg by mouth daily.    . timolol (TIMOPTIC-XR) 0.5 % ophthalmic gel-forming Place 1 drop into both eyes daily.    . traMADol (ULTRAM) 50 MG tablet Take 50 mg by mouth 4 (four) times daily.    . traZODone (DESYREL) 150 MG tablet Take 150 mg by mouth at bedtime.     No current facility-administered medications for this visit.      Physical Exam  Blood pressure 108/63, pulse (!) 48, temperature 97.9 F (36.6 C), height 6\' 1"  (1.854 m), weight 245 lb (111.1 kg).  Constitutional: overall normal hygiene, normal nutrition, well developed, normal grooming, normal body habitus. Assistive device:none  Musculoskeletal: gait and station Limp none, muscle tone and strength are normal, no tremors or atrophy is present.  .  Neurological: coordination overall normal.  Deep tendon reflex/nerve stretch intact.  Sensation normal.  Cranial nerves II-XII intact.   Skin:  Normal overall no scars, lesions, ulcers or rashes. No psoriasis.  Psychiatric: Alert and oriented x 3.  Recent memory intact, remote memory unclear.  Normal mood and affect. Well groomed.  Good eye contact.  Cardiovascular: overall no swelling, no varicosities, no edema bilaterally, normal temperatures of the legs and arms, no clubbing, cyanosis and good capillary refill.  Lymphatic: palpation is normal.  He has some tenderness of the right upper trapezius and full ROM of the neck.  NV is intact.  The patient has been educated about the nature of the problem(s) and counseled on treatment options.  The patient appeared to understand what I have discussed and is in agreement with it.  Encounter Diagnoses  Name Primary?  . Neck pain Yes  . Cervical disc disease      PLAN Call if any problems.  Precautions discussed.  Continue current medications.   Return to clinic 3 weeks   Electronically Signed Sanjuana Kava, MD 11/21/20178:39 AM

## 2016-02-14 DIAGNOSIS — M79601 Pain in right arm: Secondary | ICD-10-CM | POA: Diagnosis not present

## 2016-02-14 DIAGNOSIS — M542 Cervicalgia: Secondary | ICD-10-CM | POA: Diagnosis not present

## 2016-02-14 DIAGNOSIS — M5022 Other cervical disc displacement, mid-cervical region, unspecified level: Secondary | ICD-10-CM | POA: Diagnosis not present

## 2016-02-16 DIAGNOSIS — M5022 Other cervical disc displacement, mid-cervical region, unspecified level: Secondary | ICD-10-CM | POA: Diagnosis not present

## 2016-02-16 DIAGNOSIS — S13160A Subluxation of C5/C6 cervical vertebrae, initial encounter: Secondary | ICD-10-CM | POA: Diagnosis not present

## 2016-02-16 DIAGNOSIS — M9901 Segmental and somatic dysfunction of cervical region: Secondary | ICD-10-CM | POA: Diagnosis not present

## 2016-02-16 DIAGNOSIS — M79601 Pain in right arm: Secondary | ICD-10-CM | POA: Diagnosis not present

## 2016-02-16 DIAGNOSIS — S332XXA Dislocation of sacroiliac and sacrococcygeal joint, initial encounter: Secondary | ICD-10-CM | POA: Diagnosis not present

## 2016-02-16 DIAGNOSIS — M542 Cervicalgia: Secondary | ICD-10-CM | POA: Diagnosis not present

## 2016-02-16 DIAGNOSIS — M9904 Segmental and somatic dysfunction of sacral region: Secondary | ICD-10-CM | POA: Diagnosis not present

## 2016-02-21 DIAGNOSIS — M79601 Pain in right arm: Secondary | ICD-10-CM | POA: Diagnosis not present

## 2016-02-21 DIAGNOSIS — M542 Cervicalgia: Secondary | ICD-10-CM | POA: Diagnosis not present

## 2016-02-21 DIAGNOSIS — M5022 Other cervical disc displacement, mid-cervical region, unspecified level: Secondary | ICD-10-CM | POA: Diagnosis not present

## 2016-02-23 DIAGNOSIS — M542 Cervicalgia: Secondary | ICD-10-CM | POA: Diagnosis not present

## 2016-02-23 DIAGNOSIS — M5022 Other cervical disc displacement, mid-cervical region, unspecified level: Secondary | ICD-10-CM | POA: Diagnosis not present

## 2016-02-23 DIAGNOSIS — M79601 Pain in right arm: Secondary | ICD-10-CM | POA: Diagnosis not present

## 2016-02-25 DIAGNOSIS — M5022 Other cervical disc displacement, mid-cervical region, unspecified level: Secondary | ICD-10-CM | POA: Diagnosis not present

## 2016-02-25 DIAGNOSIS — M79601 Pain in right arm: Secondary | ICD-10-CM | POA: Diagnosis not present

## 2016-02-25 DIAGNOSIS — M542 Cervicalgia: Secondary | ICD-10-CM | POA: Diagnosis not present

## 2016-02-28 DIAGNOSIS — M79601 Pain in right arm: Secondary | ICD-10-CM | POA: Diagnosis not present

## 2016-02-28 DIAGNOSIS — M5022 Other cervical disc displacement, mid-cervical region, unspecified level: Secondary | ICD-10-CM | POA: Diagnosis not present

## 2016-02-28 DIAGNOSIS — M542 Cervicalgia: Secondary | ICD-10-CM | POA: Diagnosis not present

## 2016-02-29 ENCOUNTER — Ambulatory Visit (INDEPENDENT_AMBULATORY_CARE_PROVIDER_SITE_OTHER): Payer: Medicare Other | Admitting: Orthopaedic Surgery

## 2016-02-29 ENCOUNTER — Encounter: Payer: Self-pay | Admitting: Orthopaedic Surgery

## 2016-02-29 VITALS — BP 128/78 | HR 56 | Temp 98.1°F | Ht 74.0 in | Wt 246.0 lb

## 2016-02-29 DIAGNOSIS — M5090 Cervical disc disorder, unspecified, unspecified cervical region: Secondary | ICD-10-CM

## 2016-02-29 DIAGNOSIS — M542 Cervicalgia: Secondary | ICD-10-CM

## 2016-02-29 NOTE — Progress Notes (Signed)
Patient Norman Blake, male DOB:Oct 10, 1940, 75 y.o. [id number]  Chief Complaint  Patient presents with  . Follow-up    neck and shoulder pain    HPI  Norman Blake is a 75 y.o. male who has chronic neck pain. He has been going to therapy in [place].  I have reviewed their notes. He says he is about 75% better now.  He wants to stop therapy.  He knows the exercises and also has regular visits to massage treatments elsewhere.  He has no trauma, no paresthesias.   HPI  Body mass index is 31.58 kg/m.  ROS  Review of Systems  HENT: Negative for congestion.   Respiratory: Negative for cough and shortness of breath.   Cardiovascular: Negative for chest pain and leg swelling.  Endocrine: Positive for cold intolerance.  Musculoskeletal: Positive for arthralgias and neck pain.  Allergic/Immunologic: Positive for environmental allergies.    Past Medical History:  Diagnosis Date  . Depression   . History of hiatal hernia   . 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 CHOLECYSTECTOMY    . COLONOSCOPY N/A 06/09/2014   Procedure: COLONOSCOPY;  Surgeon: Norman Signs Md, Blake;  Location: [facility];  Service: Gastroenterology;  Laterality: N/A;    Family History  Problem Relation Age of Onset  . Breast cancer Mother   . Cancer Father     Social History Social History  Substance Use Topics  . Smoking status: Former Smoker    Packs/day: 0.50    Years: 3.00    Quit date: 06/09/1958  . Smokeless tobacco: Never Used  . Alcohol use No    No Known Allergies  Current Outpatient Prescriptions  Medication Sig Dispense Refill  . DULoxetine (CYMBALTA) 60 MG capsule Take 60 mg by mouth daily.    . finasteride (PROSCAR) 5 MG tablet Take 5 mg by mouth at bedtime.    Marland Kitchen HYDROcodone-acetaminophen (NORCO) 7.5-325 MG tablet One every four hours for pain as needed.  Do not drive  car or operate machinery while taking this medicine.  Must last 14 days. 56 tablet 0  . latanoprost (XALATAN) 0.005 % ophthalmic solution Place 1 drop into both eyes at bedtime.    . Multiple Vitamin (MULTIVITAMIN) tablet Take 1 tablet by mouth daily.    . predniSONE (STERAPRED UNI-PAK 21 TAB) 5 MG (21) TBPK tablet Take 6 pills first day; 5 pills second day; 4 pills third day; 3 pills fourth day; 2 pills next day and 1 pill last day. 21 tablet 0  . pregabalin (LYRICA) 50 MG capsule Take 50 mg by mouth 3 (three) times daily.     . simvastatin (ZOCOR) 80 MG tablet Take 40 mg by mouth at bedtime.    . tamsulosin (FLOMAX) 0.4 MG CAPS capsule Take 0.4 mg by mouth daily.    . timolol (TIMOPTIC-XR) 0.5 % ophthalmic gel-forming Place 1 drop into both eyes daily.    . traMADol (ULTRAM) 50 MG tablet Take 50 mg by mouth 4 (four) times daily.    . traZODone (DESYREL) 150 MG tablet Take 150 mg by mouth at bedtime.     No current facility-administered medications for this visit.      Physical Exam  Blood pressure 128/78, pulse (!) 56, temperature 98.1 F (36.7 C), height 6\' 2"  (1.88 m), weight 246 lb (111.6 kg).  Constitutional: overall normal hygiene, normal nutrition,  well developed, normal grooming, normal body habitus. Assistive device:none  Musculoskeletal: gait and station Limp none, muscle tone and strength are normal, no tremors or atrophy is present.  .  Neurological: coordination overall normal.  Deep tendon reflex/nerve stretch intact.  Sensation normal.  Cranial nerves II-XII intact.   Skin:   Normal overall no scars, lesions, ulcers or rashes. No psoriasis.  Psychiatric: Alert and oriented x 3.  Recent memory intact, remote memory unclear.  Normal mood and affect. Well groomed.  Good eye contact.  Cardiovascular: overall no swelling, no varicosities, no edema bilaterally, normal temperatures of the legs and arms, no clubbing, cyanosis and good capillary refill.  Lymphatic: palpation  is normal.  He has full ROM of the neck and no pain or spasm. ROM of shoulders full.  Grips normal.  NV intact.  The patient has been educated about the nature of the problem(s) and counseled on treatment options.  The patient appeared to understand what I have discussed and is in agreement with it.  Encounter Diagnoses  Name Primary?  . Neck pain Yes  . Cervical disc disease     PLAN Call if any problems.  Precautions discussed.  Continue current medications.   Return to clinic PRN   Electronically [place], Blake 12/12/20178:28 AM

## 2016-03-23 DIAGNOSIS — M9904 Segmental and somatic dysfunction of sacral region: Secondary | ICD-10-CM | POA: Diagnosis not present

## 2016-03-23 DIAGNOSIS — S332XXA Dislocation of sacroiliac and sacrococcygeal joint, initial encounter: Secondary | ICD-10-CM | POA: Diagnosis not present

## 2016-03-23 DIAGNOSIS — M9901 Segmental and somatic dysfunction of cervical region: Secondary | ICD-10-CM | POA: Diagnosis not present

## 2016-03-23 DIAGNOSIS — S13160A Subluxation of C5/C6 cervical vertebrae, initial encounter: Secondary | ICD-10-CM | POA: Diagnosis not present

## 2016-04-12 DIAGNOSIS — R972 Elevated prostate specific antigen [PSA]: Secondary | ICD-10-CM | POA: Diagnosis not present

## 2016-04-19 DIAGNOSIS — N401 Enlarged prostate with lower urinary tract symptoms: Secondary | ICD-10-CM | POA: Diagnosis not present

## 2016-04-19 DIAGNOSIS — R3912 Poor urinary stream: Secondary | ICD-10-CM | POA: Diagnosis not present

## 2016-04-19 DIAGNOSIS — R972 Elevated prostate specific antigen [PSA]: Secondary | ICD-10-CM | POA: Diagnosis not present

## 2016-04-19 DIAGNOSIS — R319 Hematuria, unspecified: Secondary | ICD-10-CM | POA: Diagnosis not present

## 2016-04-24 DIAGNOSIS — M9904 Segmental and somatic dysfunction of sacral region: Secondary | ICD-10-CM | POA: Diagnosis not present

## 2016-04-24 DIAGNOSIS — M9901 Segmental and somatic dysfunction of cervical region: Secondary | ICD-10-CM | POA: Diagnosis not present

## 2016-04-24 DIAGNOSIS — S13160A Subluxation of C5/C6 cervical vertebrae, initial encounter: Secondary | ICD-10-CM | POA: Diagnosis not present

## 2016-04-24 DIAGNOSIS — S332XXA Dislocation of sacroiliac and sacrococcygeal joint, initial encounter: Secondary | ICD-10-CM | POA: Diagnosis not present

## 2016-05-11 DIAGNOSIS — M9904 Segmental and somatic dysfunction of sacral region: Secondary | ICD-10-CM | POA: Diagnosis not present

## 2016-05-11 DIAGNOSIS — S332XXA Dislocation of sacroiliac and sacrococcygeal joint, initial encounter: Secondary | ICD-10-CM | POA: Diagnosis not present

## 2016-05-11 DIAGNOSIS — M9901 Segmental and somatic dysfunction of cervical region: Secondary | ICD-10-CM | POA: Diagnosis not present

## 2016-05-11 DIAGNOSIS — S13160A Subluxation of C5/C6 cervical vertebrae, initial encounter: Secondary | ICD-10-CM | POA: Diagnosis not present

## 2016-05-15 DIAGNOSIS — S13160A Subluxation of C5/C6 cervical vertebrae, initial encounter: Secondary | ICD-10-CM | POA: Diagnosis not present

## 2016-05-15 DIAGNOSIS — M9904 Segmental and somatic dysfunction of sacral region: Secondary | ICD-10-CM | POA: Diagnosis not present

## 2016-05-15 DIAGNOSIS — S332XXA Dislocation of sacroiliac and sacrococcygeal joint, initial encounter: Secondary | ICD-10-CM | POA: Diagnosis not present

## 2016-05-15 DIAGNOSIS — M9901 Segmental and somatic dysfunction of cervical region: Secondary | ICD-10-CM | POA: Diagnosis not present

## 2016-05-23 DIAGNOSIS — M9901 Segmental and somatic dysfunction of cervical region: Secondary | ICD-10-CM | POA: Diagnosis not present

## 2016-05-23 DIAGNOSIS — S13160A Subluxation of C5/C6 cervical vertebrae, initial encounter: Secondary | ICD-10-CM | POA: Diagnosis not present

## 2016-05-23 DIAGNOSIS — S332XXA Dislocation of sacroiliac and sacrococcygeal joint, initial encounter: Secondary | ICD-10-CM | POA: Diagnosis not present

## 2016-05-23 DIAGNOSIS — M9904 Segmental and somatic dysfunction of sacral region: Secondary | ICD-10-CM | POA: Diagnosis not present

## 2016-06-20 DIAGNOSIS — M9901 Segmental and somatic dysfunction of cervical region: Secondary | ICD-10-CM | POA: Diagnosis not present

## 2016-06-20 DIAGNOSIS — S13160A Subluxation of C5/C6 cervical vertebrae, initial encounter: Secondary | ICD-10-CM | POA: Diagnosis not present

## 2016-06-20 DIAGNOSIS — M9904 Segmental and somatic dysfunction of sacral region: Secondary | ICD-10-CM | POA: Diagnosis not present

## 2016-06-20 DIAGNOSIS — S332XXA Dislocation of sacroiliac and sacrococcygeal joint, initial encounter: Secondary | ICD-10-CM | POA: Diagnosis not present

## 2016-06-29 DIAGNOSIS — M9901 Segmental and somatic dysfunction of cervical region: Secondary | ICD-10-CM | POA: Diagnosis not present

## 2016-06-29 DIAGNOSIS — S332XXA Dislocation of sacroiliac and sacrococcygeal joint, initial encounter: Secondary | ICD-10-CM | POA: Diagnosis not present

## 2016-06-29 DIAGNOSIS — S13160A Subluxation of C5/C6 cervical vertebrae, initial encounter: Secondary | ICD-10-CM | POA: Diagnosis not present

## 2016-06-29 DIAGNOSIS — M9904 Segmental and somatic dysfunction of sacral region: Secondary | ICD-10-CM | POA: Diagnosis not present

## 2016-07-03 DIAGNOSIS — S13160A Subluxation of C5/C6 cervical vertebrae, initial encounter: Secondary | ICD-10-CM | POA: Diagnosis not present

## 2016-07-03 DIAGNOSIS — M9901 Segmental and somatic dysfunction of cervical region: Secondary | ICD-10-CM | POA: Diagnosis not present

## 2016-07-03 DIAGNOSIS — M9904 Segmental and somatic dysfunction of sacral region: Secondary | ICD-10-CM | POA: Diagnosis not present

## 2016-07-03 DIAGNOSIS — S332XXA Dislocation of sacroiliac and sacrococcygeal joint, initial encounter: Secondary | ICD-10-CM | POA: Diagnosis not present

## 2016-07-07 DIAGNOSIS — F4310 Post-traumatic stress disorder, unspecified: Secondary | ICD-10-CM | POA: Diagnosis not present

## 2016-07-07 DIAGNOSIS — G609 Hereditary and idiopathic neuropathy, unspecified: Secondary | ICD-10-CM | POA: Diagnosis not present

## 2016-07-07 DIAGNOSIS — M545 Low back pain: Secondary | ICD-10-CM | POA: Diagnosis not present

## 2016-07-07 DIAGNOSIS — N401 Enlarged prostate with lower urinary tract symptoms: Secondary | ICD-10-CM | POA: Diagnosis not present

## 2016-07-13 DIAGNOSIS — M9901 Segmental and somatic dysfunction of cervical region: Secondary | ICD-10-CM | POA: Diagnosis not present

## 2016-07-13 DIAGNOSIS — M9904 Segmental and somatic dysfunction of sacral region: Secondary | ICD-10-CM | POA: Diagnosis not present

## 2016-07-13 DIAGNOSIS — S332XXA Dislocation of sacroiliac and sacrococcygeal joint, initial encounter: Secondary | ICD-10-CM | POA: Diagnosis not present

## 2016-07-13 DIAGNOSIS — S13160A Subluxation of C5/C6 cervical vertebrae, initial encounter: Secondary | ICD-10-CM | POA: Diagnosis not present

## 2016-07-17 DIAGNOSIS — G8929 Other chronic pain: Secondary | ICD-10-CM | POA: Diagnosis not present

## 2016-07-17 DIAGNOSIS — M25511 Pain in right shoulder: Secondary | ICD-10-CM | POA: Diagnosis not present

## 2016-07-27 DIAGNOSIS — M25511 Pain in right shoulder: Secondary | ICD-10-CM | POA: Diagnosis not present

## 2016-07-27 DIAGNOSIS — G8929 Other chronic pain: Secondary | ICD-10-CM | POA: Diagnosis not present

## 2016-08-03 DIAGNOSIS — G8929 Other chronic pain: Secondary | ICD-10-CM | POA: Diagnosis not present

## 2016-08-03 DIAGNOSIS — M25511 Pain in right shoulder: Secondary | ICD-10-CM | POA: Diagnosis not present

## 2016-08-08 DIAGNOSIS — S13160A Subluxation of C5/C6 cervical vertebrae, initial encounter: Secondary | ICD-10-CM | POA: Diagnosis not present

## 2016-08-08 DIAGNOSIS — M9901 Segmental and somatic dysfunction of cervical region: Secondary | ICD-10-CM | POA: Diagnosis not present

## 2016-08-21 DIAGNOSIS — S13160A Subluxation of C5/C6 cervical vertebrae, initial encounter: Secondary | ICD-10-CM | POA: Diagnosis not present

## 2016-08-21 DIAGNOSIS — M9901 Segmental and somatic dysfunction of cervical region: Secondary | ICD-10-CM | POA: Diagnosis not present

## 2016-08-31 DIAGNOSIS — M9901 Segmental and somatic dysfunction of cervical region: Secondary | ICD-10-CM | POA: Diagnosis not present

## 2016-08-31 DIAGNOSIS — S13160A Subluxation of C5/C6 cervical vertebrae, initial encounter: Secondary | ICD-10-CM | POA: Diagnosis not present

## 2016-09-19 DIAGNOSIS — M9901 Segmental and somatic dysfunction of cervical region: Secondary | ICD-10-CM | POA: Diagnosis not present

## 2016-09-19 DIAGNOSIS — S13160A Subluxation of C5/C6 cervical vertebrae, initial encounter: Secondary | ICD-10-CM | POA: Diagnosis not present

## 2016-10-05 DIAGNOSIS — R972 Elevated prostate specific antigen [PSA]: Secondary | ICD-10-CM | POA: Diagnosis not present

## 2016-10-16 DIAGNOSIS — M9901 Segmental and somatic dysfunction of cervical region: Secondary | ICD-10-CM | POA: Diagnosis not present

## 2016-10-16 DIAGNOSIS — S13160A Subluxation of C5/C6 cervical vertebrae, initial encounter: Secondary | ICD-10-CM | POA: Diagnosis not present

## 2016-11-02 DIAGNOSIS — S13160A Subluxation of C5/C6 cervical vertebrae, initial encounter: Secondary | ICD-10-CM | POA: Diagnosis not present

## 2016-11-02 DIAGNOSIS — M9901 Segmental and somatic dysfunction of cervical region: Secondary | ICD-10-CM | POA: Diagnosis not present

## 2016-11-15 DIAGNOSIS — Z79899 Other long term (current) drug therapy: Secondary | ICD-10-CM | POA: Diagnosis not present

## 2016-11-15 DIAGNOSIS — F419 Anxiety disorder, unspecified: Secondary | ICD-10-CM | POA: Diagnosis not present

## 2016-11-15 DIAGNOSIS — M79606 Pain in leg, unspecified: Secondary | ICD-10-CM | POA: Diagnosis not present

## 2016-11-15 DIAGNOSIS — E785 Hyperlipidemia, unspecified: Secondary | ICD-10-CM | POA: Diagnosis not present

## 2016-11-15 DIAGNOSIS — R269 Unspecified abnormalities of gait and mobility: Secondary | ICD-10-CM | POA: Diagnosis not present

## 2016-11-15 DIAGNOSIS — G629 Polyneuropathy, unspecified: Secondary | ICD-10-CM | POA: Diagnosis not present

## 2016-11-21 DIAGNOSIS — M9901 Segmental and somatic dysfunction of cervical region: Secondary | ICD-10-CM | POA: Diagnosis not present

## 2016-11-21 DIAGNOSIS — S332XXA Dislocation of sacroiliac and sacrococcygeal joint, initial encounter: Secondary | ICD-10-CM | POA: Diagnosis not present

## 2016-11-21 DIAGNOSIS — M9904 Segmental and somatic dysfunction of sacral region: Secondary | ICD-10-CM | POA: Diagnosis not present

## 2016-11-21 DIAGNOSIS — S13160A Subluxation of C5/C6 cervical vertebrae, initial encounter: Secondary | ICD-10-CM | POA: Diagnosis not present

## 2016-11-27 DIAGNOSIS — M9904 Segmental and somatic dysfunction of sacral region: Secondary | ICD-10-CM | POA: Diagnosis not present

## 2016-11-27 DIAGNOSIS — M9901 Segmental and somatic dysfunction of cervical region: Secondary | ICD-10-CM | POA: Diagnosis not present

## 2016-11-27 DIAGNOSIS — S13160A Subluxation of C5/C6 cervical vertebrae, initial encounter: Secondary | ICD-10-CM | POA: Diagnosis not present

## 2016-11-27 DIAGNOSIS — S332XXA Dislocation of sacroiliac and sacrococcygeal joint, initial encounter: Secondary | ICD-10-CM | POA: Diagnosis not present

## 2016-12-19 DIAGNOSIS — S13160A Subluxation of C5/C6 cervical vertebrae, initial encounter: Secondary | ICD-10-CM | POA: Diagnosis not present

## 2016-12-19 DIAGNOSIS — M9904 Segmental and somatic dysfunction of sacral region: Secondary | ICD-10-CM | POA: Diagnosis not present

## 2016-12-19 DIAGNOSIS — M9901 Segmental and somatic dysfunction of cervical region: Secondary | ICD-10-CM | POA: Diagnosis not present

## 2016-12-19 DIAGNOSIS — S332XXA Dislocation of sacroiliac and sacrococcygeal joint, initial encounter: Secondary | ICD-10-CM | POA: Diagnosis not present

## 2016-12-20 DIAGNOSIS — R972 Elevated prostate specific antigen [PSA]: Secondary | ICD-10-CM | POA: Diagnosis not present

## 2016-12-20 DIAGNOSIS — R3914 Feeling of incomplete bladder emptying: Secondary | ICD-10-CM | POA: Diagnosis not present

## 2016-12-20 DIAGNOSIS — N3943 Post-void dribbling: Secondary | ICD-10-CM | POA: Diagnosis not present

## 2016-12-20 DIAGNOSIS — N401 Enlarged prostate with lower urinary tract symptoms: Secondary | ICD-10-CM | POA: Diagnosis not present

## 2017-01-08 DIAGNOSIS — R3914 Feeling of incomplete bladder emptying: Secondary | ICD-10-CM | POA: Diagnosis not present

## 2017-01-08 DIAGNOSIS — Z23 Encounter for immunization: Secondary | ICD-10-CM | POA: Diagnosis not present

## 2017-01-08 DIAGNOSIS — N401 Enlarged prostate with lower urinary tract symptoms: Secondary | ICD-10-CM | POA: Diagnosis not present

## 2017-01-08 DIAGNOSIS — M4800 Spinal stenosis, site unspecified: Secondary | ICD-10-CM | POA: Diagnosis not present

## 2017-01-08 DIAGNOSIS — F4310 Post-traumatic stress disorder, unspecified: Secondary | ICD-10-CM | POA: Diagnosis not present

## 2017-01-09 DIAGNOSIS — M9901 Segmental and somatic dysfunction of cervical region: Secondary | ICD-10-CM | POA: Diagnosis not present

## 2017-01-09 DIAGNOSIS — M9904 Segmental and somatic dysfunction of sacral region: Secondary | ICD-10-CM | POA: Diagnosis not present

## 2017-01-09 DIAGNOSIS — S13160A Subluxation of C5/C6 cervical vertebrae, initial encounter: Secondary | ICD-10-CM | POA: Diagnosis not present

## 2017-01-09 DIAGNOSIS — S332XXA Dislocation of sacroiliac and sacrococcygeal joint, initial encounter: Secondary | ICD-10-CM | POA: Diagnosis not present

## 2017-01-22 DIAGNOSIS — R972 Elevated prostate specific antigen [PSA]: Secondary | ICD-10-CM | POA: Diagnosis not present

## 2017-01-22 DIAGNOSIS — N401 Enlarged prostate with lower urinary tract symptoms: Secondary | ICD-10-CM | POA: Diagnosis not present

## 2017-01-22 DIAGNOSIS — D075 Carcinoma in situ of prostate: Secondary | ICD-10-CM | POA: Diagnosis not present

## 2017-02-12 DIAGNOSIS — R972 Elevated prostate specific antigen [PSA]: Secondary | ICD-10-CM | POA: Diagnosis not present

## 2017-02-12 DIAGNOSIS — N401 Enlarged prostate with lower urinary tract symptoms: Secondary | ICD-10-CM | POA: Diagnosis not present

## 2017-02-19 DIAGNOSIS — S13160A Subluxation of C5/C6 cervical vertebrae, initial encounter: Secondary | ICD-10-CM | POA: Diagnosis not present

## 2017-02-19 DIAGNOSIS — M9901 Segmental and somatic dysfunction of cervical region: Secondary | ICD-10-CM | POA: Diagnosis not present

## 2017-02-19 DIAGNOSIS — M9904 Segmental and somatic dysfunction of sacral region: Secondary | ICD-10-CM | POA: Diagnosis not present

## 2017-02-19 DIAGNOSIS — S332XXA Dislocation of sacroiliac and sacrococcygeal joint, initial encounter: Secondary | ICD-10-CM | POA: Diagnosis not present

## 2017-03-15 DIAGNOSIS — S13160A Subluxation of C5/C6 cervical vertebrae, initial encounter: Secondary | ICD-10-CM | POA: Diagnosis not present

## 2017-03-15 DIAGNOSIS — S332XXA Dislocation of sacroiliac and sacrococcygeal joint, initial encounter: Secondary | ICD-10-CM | POA: Diagnosis not present

## 2017-03-15 DIAGNOSIS — M9901 Segmental and somatic dysfunction of cervical region: Secondary | ICD-10-CM | POA: Diagnosis not present

## 2017-03-15 DIAGNOSIS — M9904 Segmental and somatic dysfunction of sacral region: Secondary | ICD-10-CM | POA: Diagnosis not present

## 2017-04-03 DIAGNOSIS — S13160A Subluxation of C5/C6 cervical vertebrae, initial encounter: Secondary | ICD-10-CM | POA: Diagnosis not present

## 2017-04-03 DIAGNOSIS — M9901 Segmental and somatic dysfunction of cervical region: Secondary | ICD-10-CM | POA: Diagnosis not present

## 2017-04-12 DIAGNOSIS — S13160A Subluxation of C5/C6 cervical vertebrae, initial encounter: Secondary | ICD-10-CM | POA: Diagnosis not present

## 2017-04-12 DIAGNOSIS — M9901 Segmental and somatic dysfunction of cervical region: Secondary | ICD-10-CM | POA: Diagnosis not present

## 2017-04-26 DIAGNOSIS — S13160A Subluxation of C5/C6 cervical vertebrae, initial encounter: Secondary | ICD-10-CM | POA: Diagnosis not present

## 2017-04-26 DIAGNOSIS — M9901 Segmental and somatic dysfunction of cervical region: Secondary | ICD-10-CM | POA: Diagnosis not present

## 2017-05-11 DIAGNOSIS — M5431 Sciatica, right side: Secondary | ICD-10-CM | POA: Diagnosis not present

## 2017-05-11 DIAGNOSIS — F4310 Post-traumatic stress disorder, unspecified: Secondary | ICD-10-CM | POA: Diagnosis not present

## 2017-05-11 DIAGNOSIS — N401 Enlarged prostate with lower urinary tract symptoms: Secondary | ICD-10-CM | POA: Diagnosis not present

## 2017-05-11 DIAGNOSIS — G609 Hereditary and idiopathic neuropathy, unspecified: Secondary | ICD-10-CM | POA: Diagnosis not present

## 2017-05-31 DIAGNOSIS — S13160A Subluxation of C5/C6 cervical vertebrae, initial encounter: Secondary | ICD-10-CM | POA: Diagnosis not present

## 2017-05-31 DIAGNOSIS — M9901 Segmental and somatic dysfunction of cervical region: Secondary | ICD-10-CM | POA: Diagnosis not present

## 2017-06-21 DIAGNOSIS — M9901 Segmental and somatic dysfunction of cervical region: Secondary | ICD-10-CM | POA: Diagnosis not present

## 2017-06-21 DIAGNOSIS — S13160A Subluxation of C5/C6 cervical vertebrae, initial encounter: Secondary | ICD-10-CM | POA: Diagnosis not present

## 2017-07-09 DIAGNOSIS — M25511 Pain in right shoulder: Secondary | ICD-10-CM | POA: Diagnosis not present

## 2017-07-19 DIAGNOSIS — M9901 Segmental and somatic dysfunction of cervical region: Secondary | ICD-10-CM | POA: Diagnosis not present

## 2017-07-19 DIAGNOSIS — S13160A Subluxation of C5/C6 cervical vertebrae, initial encounter: Secondary | ICD-10-CM | POA: Diagnosis not present

## 2017-07-24 DIAGNOSIS — M25511 Pain in right shoulder: Secondary | ICD-10-CM | POA: Diagnosis not present

## 2017-08-03 DIAGNOSIS — M9901 Segmental and somatic dysfunction of cervical region: Secondary | ICD-10-CM | POA: Diagnosis not present

## 2017-08-03 DIAGNOSIS — S13160A Subluxation of C5/C6 cervical vertebrae, initial encounter: Secondary | ICD-10-CM | POA: Diagnosis not present

## 2017-08-09 DIAGNOSIS — S13160A Subluxation of C5/C6 cervical vertebrae, initial encounter: Secondary | ICD-10-CM | POA: Diagnosis not present

## 2017-08-09 DIAGNOSIS — M9901 Segmental and somatic dysfunction of cervical region: Secondary | ICD-10-CM | POA: Diagnosis not present

## 2017-08-21 DIAGNOSIS — M9902 Segmental and somatic dysfunction of thoracic region: Secondary | ICD-10-CM | POA: Diagnosis not present

## 2017-08-21 DIAGNOSIS — M50323 Other cervical disc degeneration at C6-C7 level: Secondary | ICD-10-CM | POA: Diagnosis not present

## 2017-08-21 DIAGNOSIS — M5412 Radiculopathy, cervical region: Secondary | ICD-10-CM | POA: Diagnosis not present

## 2017-08-21 DIAGNOSIS — M9901 Segmental and somatic dysfunction of cervical region: Secondary | ICD-10-CM | POA: Diagnosis not present

## 2017-09-03 ENCOUNTER — Emergency Department (HOSPITAL_COMMUNITY): Payer: Medicare Other

## 2017-09-03 ENCOUNTER — Other Ambulatory Visit: Payer: Self-pay

## 2017-09-03 ENCOUNTER — Emergency Department (HOSPITAL_COMMUNITY)
Admission: EM | Admit: 2017-09-03 | Discharge: 2017-09-03 | Disposition: A | Discharge status: Home / Self Care | Payer: Medicare Other | Attending: Emergency Medicine | Admitting: Emergency Medicine

## 2017-09-03 DIAGNOSIS — R2241 Localized swelling, mass and lump, right lower limb: Secondary | ICD-10-CM | POA: Diagnosis present

## 2017-09-03 DIAGNOSIS — Z87891 Personal history of nicotine dependence: Secondary | ICD-10-CM | POA: Insufficient documentation

## 2017-09-03 DIAGNOSIS — I82431 Acute embolism and thrombosis of right popliteal vein: Secondary | ICD-10-CM | POA: Diagnosis not present

## 2017-09-03 LAB — BASIC METABOLIC PANEL
Anion gap: 7 (ref 5–15)
BUN: 24 mg/dL — ABNORMAL HIGH (ref 6–20)
CALCIUM: 8.8 mg/dL — AB (ref 8.9–10.3)
CO2: 28 mmol/L (ref 22–32)
Chloride: 108 mmol/L (ref 101–111)
Creatinine, Ser: 1.15 mg/dL (ref 0.61–1.24)
GFR, EST NON AFRICAN AMERICAN: 60 mL/min — AB (ref >60)
GLUCOSE: 86 mg/dL (ref 65–99)
POTASSIUM: 4.4 mmol/L (ref 3.5–5.1)
Sodium: 143 mmol/L (ref 135–145)

## 2017-09-03 LAB — CBC WITH DIFFERENTIAL/PLATELET
BASOS ABS: 0 10*3/uL (ref 0.0–0.1)
Basophils Relative: 0 %
Eosinophils Absolute: 0.5 10*3/uL (ref 0.0–0.7)
Eosinophils Relative: 5 %
HEMATOCRIT: 39.1 % (ref 39.0–52.0)
Hemoglobin: 13.1 g/dL (ref 13.0–17.0)
LYMPHS PCT: 27 %
Lymphs Abs: 2.7 10*3/uL (ref 0.7–4.0)
MCH: 32.1 pg (ref 26.0–34.0)
MCHC: 33.5 g/dL (ref 30.0–36.0)
MCV: 95.8 fL (ref 78.0–100.0)
Monocytes Absolute: 1 10*3/uL (ref 0.1–1.0)
Monocytes Relative: 10 %
NEUTROS ABS: 5.8 10*3/uL (ref 1.7–7.7)
NEUTROS PCT: 58 %
Platelets: 188 10*3/uL (ref 150–400)
RBC: 4.08 MIL/uL — ABNORMAL LOW (ref 4.22–5.81)
RDW: 12.7 % (ref 11.5–15.5)
WBC: 10.1 10*3/uL (ref 4.0–10.5)

## 2017-09-03 MED ORDER — RIVAROXABAN 15 MG PO TABS
15.0000 mg | ORAL_TABLET | Freq: Once | ORAL | Status: AC
  Administered 2017-09-03: 15 mg via ORAL
  Filled 2017-09-03: qty 1

## 2017-09-03 MED ORDER — RIVAROXABAN (XARELTO) VTE STARTER PACK (15 & 20 MG): ORAL_TABLET | ORAL | 0 refills | Status: DC

## 2017-09-03 NOTE — ED Provider Notes (Signed)
Emergency Department Provider Note   I have reviewed the triage vital signs and the nursing notes.   HISTORY  Chief Complaint Cellulitis   HPI Norman Blake is a 77 y.o. male with PMH of hiatal hernia, neuropathy, and elevated BMI presents to the emergency department for evaluation of right leg swelling and redness.  Symptoms have been ongoing for the past 7 days.  He describes some tingling in the leg but notes his history of neuropathy may be limiting pain symptoms.  He has no difficulty ambulating and played a round of golf this morning.  He has been applying warm compress with no relief in symptoms.  He denies any fevers or chills.  No injury to the leg.  No draining or ulceration.  No prior history of DVT. No radiation of symptoms or modifying factors.    Past Medical History:  Diagnosis Date  . Depression   . History of hiatal hernia   . 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   Past Surgical History:  Procedure Laterality Date  . CHOLECYSTECTOMY    . COLONOSCOPY N/A 06/09/2014   Procedure: COLONOSCOPY;  Surgeon: Aviva Signs Md, MD;  Location: [facility];  Service: Gastroenterology;  Laterality: N/A;    Allergies Patient has no known allergies.  Family History  Problem Relation Age of Onset  . Breast cancer Mother   . Cancer Father     Social History Social History   Tobacco Use  . Smoking status: Former Smoker    Packs/day: 0.50    Years: 3.00    Pack years: 1.50    Last attempt to quit: 06/09/1958    Years since quitting: 59.2  . Smokeless tobacco: Never Used  Substance Use Topics  . Alcohol use: No  . Drug use: No    Review of Systems  Constitutional: No fever/chills Eyes: No visual changes. ENT: No sore throat. Cardiovascular: Denies chest pain. Respiratory: Denies shortness of breath. Gastrointestinal: No abdominal pain.  No  nausea, no vomiting.  No diarrhea.  No constipation. Genitourinary: Negative for dysuria. Musculoskeletal: Negative for back pain. Positive right leg pain and swelling.  Skin: Positive right leg redness.  Neurological: Negative for headaches, focal weakness or numbness.  10-point ROS otherwise negative.  ____________________________________________   PHYSICAL EXAM:  VITAL SIGNS: ED Triage Vitals  Enc Vitals Group     BP 09/03/17 1357 (!) 118/94     Pulse Rate 09/03/17 1357 72     Resp 09/03/17 1357 18     Temp 09/03/17 1357 98.1 F (36.7 C)     Temp Source 09/03/17 1357 Oral     SpO2 09/03/17 1357 98 %     Weight 09/03/17 1355 250 lb (113.4 kg)     Height 09/03/17 1355 6' 2.5" (1.892 m)     Pain Score 09/03/17 1354 3   Constitutional: Alert and oriented. Well appearing and in no acute distress. Eyes: Conjunctivae are normal.  Head: Atraumatic. Nose: No congestion/rhinnorhea. Mouth/Throat: Mucous membranes are moist.  Neck: No stridor.  Cardiovascular: Normal rate, regular rhythm. Good peripheral circulation. Grossly normal heart sounds.   Respiratory: Normal respiratory effort.  No retractions. Lungs CTAB. Gastrointestinal: Soft and nontender. No distention.  Musculoskeletal: No lower extremity tenderness. Positive right leg swelling. No gross deformities of extremities. Neurologic:  Normal speech and language. No gross focal neurologic deficits are  appreciated.  Skin:  Skin is warm, dry and intact. Erythema with slightly increased warmth over the RLE.   ____________________________________________   LABS (all labs ordered are listed, but only abnormal results are displayed)  Labs Reviewed  BASIC METABOLIC PANEL - Abnormal; Notable for the following components:      Result Value   BUN 24 (*)    Calcium 8.8 (*)    GFR calc non Af Amer 60 (*)    All other components within normal limits  CBC WITH DIFFERENTIAL/PLATELET - Abnormal; Notable for the following components:    RBC 4.08 (*)    All other components within normal limits   ____________________________________________  RADIOLOGY  US Venous Img Lower Right (dvt Study)  Result Date: 09/03/2017 CLINICAL DATA:  Right leg pain and swelling. EXAM: RIGHT LOWER EXTREMITY VENOUS DOPPLER ULTRASOUND TECHNIQUE: Gray-scale sonography with graded compression, as well as color Doppler and duplex ultrasound were performed to evaluate the lower extremity deep venous systems from the level of the common femoral vein and including the common femoral, femoral, profunda femoral, popliteal and calf veins including the posterior tibial, peroneal and gastrocnemius veins when visible. The superficial great saphenous vein was also interrogated. Spectral Doppler was utilized to evaluate flow at rest and with distal augmentation maneuvers in the common femoral, femoral and popliteal veins. COMPARISON:  None. FINDINGS: Contralateral Common Femoral Vein: Respiratory phasicity is normal and symmetric with the symptomatic side. No evidence of thrombus. Normal compressibility. Common Femoral Vein: No evidence of thrombus. Normal compressibility, respiratory phasicity and response to augmentation. Saphenofemoral Junction: No evidence of thrombus. Normal compressibility and flow on color Doppler imaging. Profunda Femoral Vein: No evidence of thrombus. Normal compressibility and flow on color Doppler imaging. Femoral Vein: No evidence of thrombus. Normal compressibility, respiratory phasicity and response to augmentation. Popliteal Vein: Occluded.  Noncompressible. Calf Veins: No evidence of thrombus. Normal compressibility and flow on color Doppler imaging. Superficial Great Saphenous Vein: No evidence of thrombus. Normal compressibility. Venous Reflux:  None. Other Findings:  None. IMPRESSION: 1. Acute deep venous thrombosis of the right popliteal vein. These results were called by telephone at the time of interpretation on 09/03/2017 at 4:53 pm to  Dr. Nanda Quinton , who verbally acknowledged these results. Electronically Signed   By: Titus Dubin M.D.   On: 09/03/2017 17:00    ____________________________________________   PROCEDURES  Procedure(s) performed:   Procedures  None ____________________________________________   INITIAL IMPRESSION / ASSESSMENT AND PLAN / ED COURSE  Pertinent labs & imaging results that were available during my care of the patient were reviewed by me and considered in my medical decision making (see chart for details).  Patient presents to the emergency department for evaluation of right leg swelling and redness.  He has no history of DVT.  The skin is slightly warm to touch but patient not having any signs or symptoms of infection.  Plan to obtain baseline labs and right lower extremity ultrasound to rule out DVT.  If ultrasound negative plan to treat with antibiotics. No CP or SOB symptoms.   05:00 PM Patient with acute right sided DVT found on ultrasound.  I discussed the results with the patient and wife in detail.  I reviewed his medication list.  Discussed starting Xarelto and following up closely with Dr. Luan Pulling.  They have an appointment on the 24th of this month but will call tomorrow to see if he needs to be seen sooner.  Patient is not experiencing any chest pain or  shortness of breath but discussed immediate return precautions if the symptoms develop.  He has no history of hemorrhagic stroke or other intracranial bleed.  No history of gastrointestinal bleeding.  His kidney function is normal.  Discussed the risks and benefits of Xarelto in detail.  We will give first dose here and sent home with a starter pack.   I have reviewed and discussed all results (EKG, imaging, lab, urine as appropriate), exam findings with patient. I have reviewed nursing notes and appropriate previous records.  I feel the patient is safe to be discharged home without further emergent workup. Discussed usual and  customary return precautions. Patient and family (if present) verbalize understanding and are comfortable with this plan.  Patient will follow-up with their primary care provider. If they do not have a primary care provider, information for follow-up has been provided to them. All questions have been answered.  ____________________________________________  FINAL CLINICAL IMPRESSION(S) / ED DIAGNOSES  Final diagnoses:  Acute deep vein thrombosis (DVT) of popliteal vein of right lower extremity (HCC)     MEDICATIONS GIVEN DURING THIS VISIT:  Medications  Rivaroxaban (XARELTO) tablet 15 mg (has no administration in time range)     NEW OUTPATIENT MEDICATIONS STARTED DURING THIS VISIT:  New Prescriptions   RIVAROXABAN 15 & 20 MG TBPK    Take as directed on package: Start with one 15mg  tablet by mouth twice a day with food. On Day 22, switch to one 20mg  tablet once a day with food.    Note:  This document was prepared using Dragon voice recognition software and may include unintentional dictation errors.  Nanda Quinton, MD Emergency Medicine    Lajoy [place], [facility], MD 09/03/17 [phone]

## 2017-09-03 NOTE — ED Notes (Signed)
Pt taken to US

## 2017-09-03 NOTE — Discharge Instructions (Signed)
You have been seen in the Emergency Department (ED) today and diagnosed with a deep vein thrombosis (DVT), which is a blood clot in one of your veins.  As we discussed, we are starting you on a blood thinner.  Though these carry with them the risk of bleeding, the risks of not treating your DVT (stroke, a clot in your lungs, etc.) are greater than the risk of taking the medication.  Please follow up with the doctor listed in these papers as recommended regarding today's ED visit.      Return to the ED if you have worsening pain, ANY trouble breathing, chest pain, or other new or worsening symptoms that concern you.  Information on my medicine - XARELTO (rivaroxaban)  This medication education was reviewed with me or my healthcare representative as part of my discharge preparation.   WHY WAS XARELTO PRESCRIBED FOR YOU? Xarelto was prescribed to treat blood clots that may have been found in the veins of your legs (deep vein thrombosis) or in your lungs (pulmonary embolism) and to reduce the risk of them occurring again.  What do you need to know about Xarelto? The starting dose is one 15 mg tablet taken TWICE daily with food for the FIRST 21 DAYS then on (enter date)  ***  the dose is changed to one 20 mg tablet taken ONCE A DAY with your evening meal.  DO NOT stop taking Xarelto without talking to the health care provider who prescribed the medication.  Refill your prescription for 20 mg tablets before you run out.  After discharge, you should have regular check-up appointments with your healthcare provider that is prescribing your Xarelto.  In the future your dose may need to be changed if your kidney function changes by a significant amount.  What do you do if you miss a dose? If you are taking Xarelto TWICE DAILY and you miss a dose, take it as soon as you remember. You may take two 15 mg tablets (total 30 mg) at the same time then resume your regularly scheduled 15 mg twice daily the  next day.  If you are taking Xarelto ONCE DAILY and you miss a dose, take it as soon as you remember on the same day then continue your regularly scheduled once daily regimen the next day. Do not take two doses of Xarelto at the same time.   Important Safety Information Xarelto is a blood thinner medicine that can cause bleeding. You should call your healthcare provider right away if you experience any of the following: ? Bleeding from an injury or your nose that does not stop. ? Unusual colored urine (red or dark brown) or unusual colored stools (red or black). ? Unusual bruising for unknown reasons. ? A serious fall or if you hit your head (even if there is no bleeding).  Some medicines may interact with Xarelto and might increase your risk of bleeding while on Xarelto. To help avoid this, consult your healthcare provider or pharmacist prior to using any new prescription or non-prescription medications, including herbals, vitamins, non-steroidal anti-inflammatory drugs (NSAIDs) and supplements.  This website has more information on Xarelto: [email or web address]

## 2017-09-03 NOTE — ED Triage Notes (Signed)
Redness and swelling since last Tuesday. Denies bug bite or other injury. Pt states had several years ago and cleared up with PO antibiotics

## 2017-09-10 DIAGNOSIS — I82401 Acute embolism and thrombosis of unspecified deep veins of right lower extremity: Secondary | ICD-10-CM | POA: Diagnosis not present

## 2017-09-10 DIAGNOSIS — C61 Malignant neoplasm of prostate: Secondary | ICD-10-CM | POA: Diagnosis not present

## 2017-09-10 DIAGNOSIS — G609 Hereditary and idiopathic neuropathy, unspecified: Secondary | ICD-10-CM | POA: Diagnosis not present

## 2017-09-10 DIAGNOSIS — M4800 Spinal stenosis, site unspecified: Secondary | ICD-10-CM | POA: Diagnosis not present

## 2017-09-17 DIAGNOSIS — M9901 Segmental and somatic dysfunction of cervical region: Secondary | ICD-10-CM | POA: Diagnosis not present

## 2017-09-17 DIAGNOSIS — S13160A Subluxation of C5/C6 cervical vertebrae, initial encounter: Secondary | ICD-10-CM | POA: Diagnosis not present

## 2017-10-03 ENCOUNTER — Other Ambulatory Visit (HOSPITAL_COMMUNITY): Payer: Self-pay | Admitting: Pulmonary Disease

## 2017-10-03 DIAGNOSIS — I824Y1 Acute embolism and thrombosis of unspecified deep veins of right proximal lower extremity: Secondary | ICD-10-CM

## 2017-10-03 DIAGNOSIS — L03115 Cellulitis of right lower limb: Secondary | ICD-10-CM | POA: Diagnosis not present

## 2017-10-03 DIAGNOSIS — G609 Hereditary and idiopathic neuropathy, unspecified: Secondary | ICD-10-CM | POA: Diagnosis not present

## 2017-10-03 DIAGNOSIS — M4800 Spinal stenosis, site unspecified: Secondary | ICD-10-CM | POA: Diagnosis not present

## 2017-10-03 DIAGNOSIS — I82401 Acute embolism and thrombosis of unspecified deep veins of right lower extremity: Secondary | ICD-10-CM | POA: Diagnosis not present

## 2017-10-04 ENCOUNTER — Ambulatory Visit (HOSPITAL_COMMUNITY)
Admission: RE | Admit: 2017-10-04 | Discharge: 2017-10-04 | Disposition: A | Discharge status: Home / Self Care | Payer: Medicare Other | Source: Ambulatory Visit | Attending: Pulmonary Disease | Admitting: Pulmonary Disease

## 2017-10-04 DIAGNOSIS — I82401 Acute embolism and thrombosis of unspecified deep veins of right lower extremity: Secondary | ICD-10-CM | POA: Diagnosis present

## 2017-10-04 DIAGNOSIS — I82431 Acute embolism and thrombosis of right popliteal vein: Secondary | ICD-10-CM | POA: Diagnosis not present

## 2017-10-04 DIAGNOSIS — I824Y1 Acute embolism and thrombosis of unspecified deep veins of right proximal lower extremity: Secondary | ICD-10-CM

## 2017-10-11 DIAGNOSIS — S13160A Subluxation of C5/C6 cervical vertebrae, initial encounter: Secondary | ICD-10-CM | POA: Diagnosis not present

## 2017-10-11 DIAGNOSIS — M9901 Segmental and somatic dysfunction of cervical region: Secondary | ICD-10-CM | POA: Diagnosis not present

## 2017-10-26 DIAGNOSIS — S13160A Subluxation of C5/C6 cervical vertebrae, initial encounter: Secondary | ICD-10-CM | POA: Diagnosis not present

## 2017-10-26 DIAGNOSIS — S332XXA Dislocation of sacroiliac and sacrococcygeal joint, initial encounter: Secondary | ICD-10-CM | POA: Diagnosis not present

## 2017-10-26 DIAGNOSIS — M9904 Segmental and somatic dysfunction of sacral region: Secondary | ICD-10-CM | POA: Diagnosis not present

## 2017-10-26 DIAGNOSIS — M9901 Segmental and somatic dysfunction of cervical region: Secondary | ICD-10-CM | POA: Diagnosis not present

## 2017-11-08 DIAGNOSIS — G609 Hereditary and idiopathic neuropathy, unspecified: Secondary | ICD-10-CM | POA: Diagnosis not present

## 2017-11-08 DIAGNOSIS — I82401 Acute embolism and thrombosis of unspecified deep veins of right lower extremity: Secondary | ICD-10-CM | POA: Diagnosis not present

## 2017-11-08 DIAGNOSIS — F4310 Post-traumatic stress disorder, unspecified: Secondary | ICD-10-CM | POA: Diagnosis not present

## 2017-11-08 DIAGNOSIS — M4800 Spinal stenosis, site unspecified: Secondary | ICD-10-CM | POA: Diagnosis not present

## 2017-11-13 DIAGNOSIS — M9901 Segmental and somatic dysfunction of cervical region: Secondary | ICD-10-CM | POA: Diagnosis not present

## 2017-11-13 DIAGNOSIS — S13160A Subluxation of C5/C6 cervical vertebrae, initial encounter: Secondary | ICD-10-CM | POA: Diagnosis not present

## 2017-11-13 DIAGNOSIS — G603 Idiopathic progressive neuropathy: Secondary | ICD-10-CM | POA: Diagnosis not present

## 2017-11-13 DIAGNOSIS — Z79899 Other long term (current) drug therapy: Secondary | ICD-10-CM | POA: Diagnosis not present

## 2017-11-13 DIAGNOSIS — R269 Unspecified abnormalities of gait and mobility: Secondary | ICD-10-CM | POA: Diagnosis not present

## 2017-11-13 DIAGNOSIS — G4701 Insomnia due to medical condition: Secondary | ICD-10-CM | POA: Diagnosis not present

## 2017-11-24 DIAGNOSIS — M25511 Pain in right shoulder: Secondary | ICD-10-CM | POA: Diagnosis not present

## 2017-12-07 DIAGNOSIS — S13160A Subluxation of C5/C6 cervical vertebrae, initial encounter: Secondary | ICD-10-CM | POA: Diagnosis not present

## 2017-12-07 DIAGNOSIS — M9901 Segmental and somatic dysfunction of cervical region: Secondary | ICD-10-CM | POA: Diagnosis not present

## 2017-12-11 ENCOUNTER — Other Ambulatory Visit (HOSPITAL_COMMUNITY): Payer: Self-pay | Admitting: Pulmonary Disease

## 2017-12-11 DIAGNOSIS — I824Z1 Acute embolism and thrombosis of unspecified deep veins of right distal lower extremity: Secondary | ICD-10-CM

## 2017-12-25 ENCOUNTER — Ambulatory Visit (HOSPITAL_COMMUNITY)
Admission: RE | Admit: 2017-12-25 | Discharge: 2017-12-25 | Disposition: A | Discharge status: Home / Self Care | Payer: Medicare Other | Source: Ambulatory Visit | Attending: Pulmonary Disease | Admitting: Pulmonary Disease

## 2017-12-25 DIAGNOSIS — I824Z1 Acute embolism and thrombosis of unspecified deep veins of right distal lower extremity: Secondary | ICD-10-CM | POA: Diagnosis not present

## 2017-12-25 DIAGNOSIS — Z86718 Personal history of other venous thrombosis and embolism: Secondary | ICD-10-CM | POA: Diagnosis not present

## 2017-12-27 DIAGNOSIS — M9901 Segmental and somatic dysfunction of cervical region: Secondary | ICD-10-CM | POA: Diagnosis not present

## 2017-12-27 DIAGNOSIS — S13160A Subluxation of C5/C6 cervical vertebrae, initial encounter: Secondary | ICD-10-CM | POA: Diagnosis not present

## 2018-01-14 DIAGNOSIS — S13160A Subluxation of C5/C6 cervical vertebrae, initial encounter: Secondary | ICD-10-CM | POA: Diagnosis not present

## 2018-01-14 DIAGNOSIS — M9901 Segmental and somatic dysfunction of cervical region: Secondary | ICD-10-CM | POA: Diagnosis not present

## 2018-01-21 DIAGNOSIS — S13160A Subluxation of C5/C6 cervical vertebrae, initial encounter: Secondary | ICD-10-CM | POA: Diagnosis not present

## 2018-01-21 DIAGNOSIS — M9901 Segmental and somatic dysfunction of cervical region: Secondary | ICD-10-CM | POA: Diagnosis not present

## 2018-02-06 DIAGNOSIS — R972 Elevated prostate specific antigen [PSA]: Secondary | ICD-10-CM | POA: Diagnosis not present

## 2018-02-18 DIAGNOSIS — M9901 Segmental and somatic dysfunction of cervical region: Secondary | ICD-10-CM | POA: Diagnosis not present

## 2018-02-18 DIAGNOSIS — S13160A Subluxation of C5/C6 cervical vertebrae, initial encounter: Secondary | ICD-10-CM | POA: Diagnosis not present

## 2018-02-28 DIAGNOSIS — Z0000 Encounter for general adult medical examination without abnormal findings: Secondary | ICD-10-CM | POA: Diagnosis not present

## 2018-03-04 DIAGNOSIS — R972 Elevated prostate specific antigen [PSA]: Secondary | ICD-10-CM | POA: Diagnosis not present

## 2018-03-04 DIAGNOSIS — N401 Enlarged prostate with lower urinary tract symptoms: Secondary | ICD-10-CM | POA: Diagnosis not present

## 2018-03-18 DIAGNOSIS — M9904 Segmental and somatic dysfunction of sacral region: Secondary | ICD-10-CM | POA: Diagnosis not present

## 2018-03-18 DIAGNOSIS — S332XXA Dislocation of sacroiliac and sacrococcygeal joint, initial encounter: Secondary | ICD-10-CM | POA: Diagnosis not present

## 2018-03-18 DIAGNOSIS — M9901 Segmental and somatic dysfunction of cervical region: Secondary | ICD-10-CM | POA: Diagnosis not present

## 2018-03-18 DIAGNOSIS — S13160A Subluxation of C5/C6 cervical vertebrae, initial encounter: Secondary | ICD-10-CM | POA: Diagnosis not present

## 2018-04-11 DIAGNOSIS — S13160A Subluxation of C5/C6 cervical vertebrae, initial encounter: Secondary | ICD-10-CM | POA: Diagnosis not present

## 2018-04-11 DIAGNOSIS — M9901 Segmental and somatic dysfunction of cervical region: Secondary | ICD-10-CM | POA: Diagnosis not present

## 2018-04-16 ENCOUNTER — Encounter: Payer: Self-pay | Admitting: Orthopaedic Surgery

## 2018-04-16 ENCOUNTER — Ambulatory Visit (INDEPENDENT_AMBULATORY_CARE_PROVIDER_SITE_OTHER): Payer: Medicare Other | Admitting: Orthopaedic Surgery

## 2018-04-16 ENCOUNTER — Ambulatory Visit (INDEPENDENT_AMBULATORY_CARE_PROVIDER_SITE_OTHER): Payer: Medicare Other

## 2018-04-16 VITALS — BP 97/62 | HR 56 | Ht 74.0 in | Wt 267.0 lb

## 2018-04-16 DIAGNOSIS — M5136 Other intervertebral disc degeneration, lumbar region: Secondary | ICD-10-CM

## 2018-04-16 DIAGNOSIS — M79604 Pain in right leg: Secondary | ICD-10-CM

## 2018-04-16 DIAGNOSIS — M545 Low back pain: Secondary | ICD-10-CM

## 2018-04-16 MED ORDER — PREDNISONE 5 MG (21) PO TBPK: ORAL_TABLET | ORAL | 0 refills | Status: DC

## 2018-04-16 NOTE — Progress Notes (Signed)
Patient Norman Blake, male DOB:1940/09/01, 78 y.o. VEL:[id number]  Chief Complaint  Patient presents with  . Back Pain    Mid and lower    HPI  Norman Blake is a 78 y.o. male who has chronic lower back pain with it getting worse. He has right sided sciatica.  He has no falls.  He has no numbness or weakness.  He has pain most of the time.  He has tried various means of helping it and it has not worked, Advil, heat, ice, rubs.  I will get MRI of the lumbar spine.  He is on gabapentin and that has not helped.   Body mass index is 34.28 kg/m.  ROS  Review of Systems  Constitutional: Positive for activity change.  Musculoskeletal: Positive for arthralgias, back pain and gait problem.  All other systems reviewed and are negative.   All other systems reviewed and are negative.  The following is a summary of the past history medically, past history surgically, known current medicines, social history and family history.  This information is gathered electronically by the computer from prior information and documentation.  I review this each visit and have found including this information at this point in the chart is beneficial and informative.    Past Medical History:  Diagnosis Date  . Depression   . History of hiatal hernia   . 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 CHOLECYSTECTOMY    . COLONOSCOPY N/A 06/09/2014   Procedure: COLONOSCOPY;  Surgeon: Aviva Signs Md, MD;  Location: [facility];  Service: Gastroenterology;  Laterality: N/A;    Family History  Problem Relation Age of Onset  . Breast cancer Mother   . Cancer Father     Social History Social History   Tobacco Use  . Smoking status: Former Smoker    Packs/day: 0.50    Years: 3.00    Pack years: 1.50    Last attempt to quit: 06/09/1958    Years since quitting: 59.8  . Smokeless tobacco: Never  Used  Substance Use Topics  . Alcohol use: No  . Drug use: No    No Known Allergies  Current Outpatient Medications  Medication Sig Dispense Refill  . DULoxetine (CYMBALTA) 60 MG capsule Take 60 mg by mouth daily.    . finasteride (PROSCAR) 5 MG tablet Take 5 mg by mouth at bedtime.    Marland Kitchen HYDROcodone-acetaminophen (NORCO) 7.5-325 MG tablet One every four hours for pain as needed.  Do not drive car or operate machinery while taking this medicine.  Must last 14 days. 56 tablet 0  . latanoprost (XALATAN) 0.005 % ophthalmic solution Place 1 drop into both eyes at bedtime.    . Multiple Vitamin (MULTIVITAMIN) tablet Take 1 tablet by mouth daily.    . predniSONE (STERAPRED UNI-PAK 21 TAB) 5 MG (21) TBPK tablet Take 6 pills first day; 5 pills second day; 4 pills third day; 3 pills fourth day; 2 pills next day and 1 pill last day. 21 tablet 0  . pregabalin (LYRICA) 50 MG capsule Take 50 mg by mouth 3 (three) times daily.     . Rivaroxaban 15 & 20 MG TBPK Take as directed on package: Start with one 15mg  tablet by mouth twice a day with food. On Day 22, switch to one 20mg  tablet once a day with food. 51 each 0  .  simvastatin (ZOCOR) 80 MG tablet Take 40 mg by mouth at bedtime.    . tamsulosin (FLOMAX) 0.4 MG CAPS capsule Take 0.4 mg by mouth daily.    . timolol (TIMOPTIC-XR) 0.5 % ophthalmic gel-forming Place 1 drop into both eyes daily.    . traMADol (ULTRAM) 50 MG tablet Take 50 mg by mouth 4 (four) times daily.    . traZODone (DESYREL) 150 MG tablet Take 150 mg by mouth at bedtime.     No current facility-administered medications for this visit.      Physical Exam  Blood pressure 97/62, pulse (!) 56, height 6\' 2"  (1.88 m), weight 267 lb (121.1 kg).  Constitutional: overall normal hygiene, normal nutrition, well developed, normal grooming, normal body habitus. Assistive device:none  Musculoskeletal: gait and station Limp right, muscle tone and strength are normal, no tremors or atrophy is  present.  .  Neurological: coordination overall normal.  Deep tendon reflex/nerve stretch intact.  Sensation normal.  Cranial nerves II-XII intact.   Skin:   Normal overall no scars, lesions, ulcers or rashes. No psoriasis.  Psychiatric: Alert and oriented x 3.  Recent memory intact, remote memory unclear.  Normal mood and affect. Well groomed.  Good eye contact.  Cardiovascular: overall no swelling, no varicosities, no edema bilaterally, normal temperatures of the legs and arms, no clubbing, cyanosis and good capillary refill.  Lymphatic: palpation is normal.  Spine/Pelvis examination:  Inspection:  Overall, sacoiliac joint benign and hips nontender; without crepitus or defects.   Thoracic spine inspection: Alignment normal without kyphosis present   Lumbar spine inspection:  Alignment  with normal lumbar lordosis, without scoliosis apparent.   Thoracic spine palpation:  without tenderness of spinal processes   Lumbar spine palpation: without tenderness of lumbar area; without tightness of lumbar muscles    Range of Motion:   Lumbar flexion, forward flexion is normal without pain or tenderness    Lumbar extension is full without pain or tenderness   Left lateral bend is normal without pain or tenderness   Right lateral bend is normal without pain or tenderness   Straight leg raising is normal  Strength & tone: normal   Stability overall normal stability  All other systems reviewed and are negative   The patient has been educated about the nature of the problem(s) and counseled on treatment options.  The patient appeared to understand what I have discussed and is in agreement with it.  Encounter Diagnosis  Name Primary?  . Low back pain radiating to right lower extremity Yes   X-rays were done of the lumbar spine reported separately.  PLAN Call if any problems.  Precautions discussed.  Continue current medications.   Return to clinic after MRI of the lumbar spine   I  have also called in prednisone dose pack today.  Electronically Signed Sanjuana Kava, MD 1/28/20209:04 AM

## 2018-04-16 NOTE — Patient Instructions (Signed)
Call [phone] to schedule the MRI scan, and then call us to make a follow up appointment

## 2018-04-21 ENCOUNTER — Ambulatory Visit
Admission: RE | Admit: 2018-04-21 | Discharge: 2018-04-21 | Disposition: A | Discharge status: Home / Self Care | Payer: Medicare Other | Source: Ambulatory Visit | Attending: Orthopaedic Surgery | Admitting: Orthopaedic Surgery

## 2018-04-21 DIAGNOSIS — M48061 Spinal stenosis, lumbar region without neurogenic claudication: Secondary | ICD-10-CM | POA: Diagnosis not present

## 2018-04-21 DIAGNOSIS — M47816 Spondylosis without myelopathy or radiculopathy, lumbar region: Secondary | ICD-10-CM | POA: Diagnosis not present

## 2018-04-21 DIAGNOSIS — M5126 Other intervertebral disc displacement, lumbar region: Secondary | ICD-10-CM | POA: Diagnosis not present

## 2018-04-21 DIAGNOSIS — M5136 Other intervertebral disc degeneration, lumbar region: Secondary | ICD-10-CM

## 2018-04-25 ENCOUNTER — Ambulatory Visit (INDEPENDENT_AMBULATORY_CARE_PROVIDER_SITE_OTHER): Payer: Medicare Other | Admitting: Orthopaedic Surgery

## 2018-04-25 ENCOUNTER — Encounter: Payer: Self-pay | Admitting: Orthopaedic Surgery

## 2018-04-25 VITALS — BP 113/69 | HR 63 | Ht 74.0 in | Wt 265.0 lb

## 2018-04-25 DIAGNOSIS — M545 Low back pain, unspecified: Secondary | ICD-10-CM

## 2018-04-25 NOTE — Progress Notes (Signed)
Patient UK:Norman Blake, male DOB:01/04/1941, 78 y.o. WCB:[id number]  Chief Complaint  Patient presents with  . Back Pain    HPI  Norman Blake is a 78 y.o. male who has lower back pain that is getting worse.  He had MRI which showed: IMPRESSION: Transitional anatomy.  See discussion above.  Severe stenosis at L4-5. Uncovering of the disc with annular bulge, 4 mm anterolisthesis, and posterior element hypertrophy. BILATERAL L5 neural impingement.  Similar less severe changes at L3-4. Central protrusion and posterior element hypertrophy; moderate to severe stenosis contributing to BILATERAL L4 nerve root impingement.  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  I have explained the findings to him.  I have recommended he see a neurosurgeon.  He does not want to have surgery.  I told him about possible epidurals but that would be up to the neurosurgeon.  He has extensive spinal stenosis and nerve root involvement.  He will discuss further with the neurosurgeon.  He is to stay on current medicine.  Activity as tolerated.   Body mass index is 34.02 kg/m.  ROS  Review of Systems  Constitutional: Positive for activity change.  Musculoskeletal: Positive for arthralgias, back pain and gait problem.  All other systems reviewed and are negative.   All other systems reviewed and are negative.  The following is a summary of the past history medically, past history surgically, known current medicines, social history and family history.  This information is gathered electronically by the computer from prior information and documentation.  I review this each visit and have found including this information at this point in the chart is beneficial and informative.    Past Medical History:  Diagnosis Date  . Depression   . History of hiatal hernia   . Neuromuscular disorder ([place])    Neuropathy feet and legs due to agent orange  exposure    Past Surgical History:  Procedure Laterality Date  . CHOLECYSTECTOMY    . COLONOSCOPY N/A 06/09/2014   Procedure: COLONOSCOPY;  Surgeon: Aviva Signs Md, MD;  Location: [facility];  Service: Gastroenterology;  Laterality: N/A;    Family History  Problem Relation Age of Onset  . Breast cancer Mother   . Cancer Father     Social History Social History   Tobacco Use  . Smoking status: Former Smoker    Packs/day: 0.50    Years: 3.00    Pack years: 1.50    Last attempt to quit: 06/09/1958    Years since quitting: 59.9  . Smokeless tobacco: Never Used  Substance Use Topics  . Alcohol use: No  . Drug use: No    No Known Allergies  Current Outpatient Medications  Medication Sig Dispense Refill  . DULoxetine (CYMBALTA) 60 MG capsule Take 60 mg by mouth daily.    . finasteride (PROSCAR) 5 MG tablet Take 5 mg by mouth at bedtime.    Marland Kitchen HYDROcodone-acetaminophen (NORCO) 7.5-325 MG tablet One every four hours for pain as needed.  Do not drive car or operate machinery while taking this medicine.  Must last 14 days. 56 tablet 0  . latanoprost (XALATAN) 0.005 % ophthalmic solution Place 1 drop into both eyes at bedtime.    . Multiple Vitamin (MULTIVITAMIN) tablet Take 1 tablet by mouth daily.    . predniSONE (STERAPRED UNI-PAK 21 TAB) 5 MG (21) TBPK tablet Take 6 pills first day; 5 pills second day; 4 pills third day; 3 pills  fourth day; 2 pills next day and 1 pill last day. 21 tablet 0  . pregabalin (LYRICA) 50 MG capsule Take 50 mg by mouth 3 (three) times daily.     . Rivaroxaban 15 & 20 MG TBPK Take as directed on package: Start with one 15mg  tablet by mouth twice a day with food. On Day 22, switch to one 20mg  tablet once a day with food. 51 each 0  . simvastatin (ZOCOR) 80 MG tablet Take 40 mg by mouth at bedtime.    . tamsulosin (FLOMAX) 0.4 MG CAPS capsule Take 0.4 mg by mouth daily.    . timolol (TIMOPTIC-XR) 0.5 % ophthalmic gel-forming Place 1 drop into both eyes  daily.    . traMADol (ULTRAM) 50 MG tablet Take 50 mg by mouth 4 (four) times daily.    . traZODone (DESYREL) 150 MG tablet Take 150 mg by mouth at bedtime.     No current facility-administered medications for this visit.      Physical Exam  Blood pressure 113/69, pulse 63, height 6\' 2"  (1.88 m), weight 265 lb (120.2 kg).  Constitutional: overall normal hygiene, normal nutrition, well developed, normal grooming, normal body habitus. Assistive device:none  Musculoskeletal: gait and station Limp none, muscle tone and strength are normal, no tremors or atrophy is present.  .  Neurological: coordination overall normal.  Deep tendon reflex/nerve stretch intact.  Sensation normal.  Cranial nerves II-XII intact.   Skin:   Normal overall no scars, lesions, ulcers or rashes. No psoriasis.  Psychiatric: Alert and oriented x 3.  Recent memory intact, remote memory unclear.  Normal mood and affect. Well groomed.  Good eye contact.  Cardiovascular: overall no swelling, no varicosities, no edema bilaterally, normal temperatures of the legs and arms, no clubbing, cyanosis and good capillary refill.  Lymphatic: palpation is normal.  Spine/Pelvis examination:  Inspection:  Overall, sacoiliac joint benign and hips nontender; without crepitus or defects.   Thoracic spine inspection: Alignment normal without kyphosis present   Lumbar spine inspection:  Alignment  with normal lumbar lordosis, without scoliosis apparent.   Thoracic spine palpation:  without tenderness of spinal processes   Lumbar spine palpation: with tenderness of lumbar area; with tightness of lumbar muscles    Range of Motion:   Lumbar flexion, forward flexion is abnormal 30 degrees with pain or tenderness    Lumbar extension is 5 with pain or tenderness   Left lateral bend is normal without pain or tenderness   Right lateral bend is normal without pain or tenderness   Straight leg raising is normal  Strength & tone:  normal   Stability overall normal stability  All other systems reviewed and are negative   The patient has been educated about the nature of the problem(s) and counseled on treatment options.  The patient appeared to understand what I have discussed and is in agreement with it.  Encounter Diagnosis  Name Primary?  . Low back pain radiating to right lower extremity Yes    PLAN Call if any problems.  Precautions discussed.  Continue current medications.   Return to clinic 6 weeks   Electronically Signed Sanjuana Kava, MD 2/6/20208:38 AM

## 2018-05-02 DIAGNOSIS — M48062 Spinal stenosis, lumbar region with neurogenic claudication: Secondary | ICD-10-CM | POA: Diagnosis not present

## 2018-05-06 ENCOUNTER — Other Ambulatory Visit: Payer: Self-pay | Admitting: Neurological Surgery

## 2018-05-06 ENCOUNTER — Other Ambulatory Visit: Payer: Self-pay | Admitting: Pulmonary Disease

## 2018-05-06 DIAGNOSIS — M48062 Spinal stenosis, lumbar region with neurogenic claudication: Secondary | ICD-10-CM

## 2018-05-13 DIAGNOSIS — R269 Unspecified abnormalities of gait and mobility: Secondary | ICD-10-CM | POA: Diagnosis not present

## 2018-05-13 DIAGNOSIS — G603 Idiopathic progressive neuropathy: Secondary | ICD-10-CM | POA: Diagnosis not present

## 2018-05-13 DIAGNOSIS — Z79899 Other long term (current) drug therapy: Secondary | ICD-10-CM | POA: Diagnosis not present

## 2018-05-13 DIAGNOSIS — G4701 Insomnia due to medical condition: Secondary | ICD-10-CM | POA: Diagnosis not present

## 2018-05-14 ENCOUNTER — Ambulatory Visit
Admission: RE | Admit: 2018-05-14 | Discharge: 2018-05-14 | Disposition: A | Discharge status: Home / Self Care | Payer: Medicare Other | Source: Ambulatory Visit | Attending: Neurological Surgery | Admitting: Neurological Surgery

## 2018-05-14 DIAGNOSIS — M47817 Spondylosis without myelopathy or radiculopathy, lumbosacral region: Secondary | ICD-10-CM | POA: Diagnosis not present

## 2018-05-14 DIAGNOSIS — M48062 Spinal stenosis, lumbar region with neurogenic claudication: Secondary | ICD-10-CM

## 2018-05-14 MED ORDER — IOPAMIDOL (ISOVUE-M 200) INJECTION 41%
1.0000 mL | Freq: Once | INTRAMUSCULAR | Status: AC
  Administered 2018-05-14: 1 mL via EPIDURAL

## 2018-05-14 MED ORDER — METHYLPREDNISOLONE ACETATE 40 MG/ML INJ SUSP (RADIOLOG
120.0000 mg | Freq: Once | INTRAMUSCULAR | Status: AC
  Administered 2018-05-14: 120 mg via EPIDURAL

## 2018-05-14 NOTE — Discharge Instructions (Signed)

## 2018-06-03 DIAGNOSIS — M9901 Segmental and somatic dysfunction of cervical region: Secondary | ICD-10-CM | POA: Diagnosis not present

## 2018-06-03 DIAGNOSIS — S13160A Subluxation of C5/C6 cervical vertebrae, initial encounter: Secondary | ICD-10-CM | POA: Diagnosis not present

## 2018-06-06 ENCOUNTER — Ambulatory Visit (INDEPENDENT_AMBULATORY_CARE_PROVIDER_SITE_OTHER): Payer: Medicare Other | Admitting: Orthopaedic Surgery

## 2018-06-06 ENCOUNTER — Other Ambulatory Visit: Payer: Self-pay

## 2018-06-06 ENCOUNTER — Encounter: Payer: Self-pay | Admitting: Orthopaedic Surgery

## 2018-06-06 VITALS — BP 120/78 | HR 52 | Ht 75.0 in | Wt 263.0 lb

## 2018-06-06 DIAGNOSIS — M545 Low back pain, unspecified: Secondary | ICD-10-CM

## 2018-06-06 NOTE — Progress Notes (Signed)
Patient Norman Blake, male DOB:14-Aug-1940, 78 y.o. EPP:[id number]  Chief Complaint  Patient presents with  . Back Pain    HPI  KELAN PRITT is a 78 y.o. male who has lower back pain.  He has seen the neurosurgeon about this.  He has taken prednisone dose pack.  He is a little better.  He still has sciatica to the right leg but it is less.  He still has bad days.  He is being active.    Body mass index is 32.87 kg/m.  ROS  Review of Systems  Constitutional: Positive for activity change.  Musculoskeletal: Positive for arthralgias, back pain and gait problem.  All other systems reviewed and are negative.   All other systems reviewed and are negative.  The following is a summary of the past history medically, past history surgically, known current medicines, social history and family history.  This information is gathered electronically by the computer from prior information and documentation.  I review this each visit and have found including this information at this point in the chart is beneficial and informative.    Past Medical History:  Diagnosis Date  . Depression   . History of hiatal hernia   . 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 CHOLECYSTECTOMY    . COLONOSCOPY N/A 06/09/2014   Procedure: COLONOSCOPY;  Surgeon: Aviva Signs Md, MD;  Location: [facility];  Service: Gastroenterology;  Laterality: N/A;    Family History  Problem Relation Age of Onset  . Breast cancer Mother   . Cancer Father     Social History Social History   Tobacco Use  . Smoking status: Former Smoker    Packs/day: 0.50    Years: 3.00    Pack years: 1.50    Last attempt to quit: 06/09/1958    Years since quitting: 60.0  . Smokeless tobacco: Never Used  Substance Use Topics  . Alcohol use: No  . Drug use: No    Allergies  Allergen Reactions  . Atorvastatin  Calcium Hives and Other (See Comments)  . Gabapentin Other (See Comments)    Current Outpatient Medications  Medication Sig Dispense Refill  . DULoxetine (CYMBALTA) 60 MG capsule Take 60 mg by mouth daily.    . finasteride (PROSCAR) 5 MG tablet Take 5 mg by mouth at bedtime.    Marland Kitchen latanoprost (XALATAN) 0.005 % ophthalmic solution Place 1 drop into both eyes at bedtime.    . Multiple Vitamin (MULTIVITAMIN) tablet Take 1 tablet by mouth daily.    . pregabalin (LYRICA) 50 MG capsule Take 50 mg by mouth 3 (three) times daily.     . Rivaroxaban 15 & 20 MG TBPK Take as directed on package: Start with one 15mg  tablet by mouth twice a day with food. On Day 22, switch to one 20mg  tablet once a day with food. 51 each 0  . simvastatin (ZOCOR) 80 MG tablet Take 40 mg by mouth at bedtime.    . tamsulosin (FLOMAX) 0.4 MG CAPS capsule Take 0.4 mg by mouth daily.    . timolol (TIMOPTIC-XR) 0.5 % ophthalmic gel-forming Place 1 drop into both eyes daily.    . traMADol (ULTRAM) 50 MG tablet Take 50 mg by mouth 4 (four) times daily.    . traZODone (DESYREL) 150 MG tablet Take 150 mg by mouth at bedtime.     No  current facility-administered medications for this visit.      Physical Exam  Blood pressure 120/78, pulse (!) 52, height 6\' 3"  (1.905 m), weight 263 lb (119.3 kg).  Constitutional: overall normal hygiene, normal nutrition, well developed, normal grooming, normal body habitus. Assistive device:none  Musculoskeletal: gait and station Limp none, muscle tone and strength are normal, no tremors or atrophy is present.  .  Neurological: coordination overall normal.  Deep tendon reflex/nerve stretch intact.  Sensation normal.  Cranial nerves II-XII intact.   Skin:   Normal overall no scars, lesions, ulcers or rashes. No psoriasis.  Psychiatric: Alert and oriented x 3.  Recent memory intact, remote memory unclear.  Normal mood and affect. Well groomed.  Good eye contact.  Cardiovascular: overall no  swelling, no varicosities, no edema bilaterally, normal temperatures of the legs and arms, no clubbing, cyanosis and good capillary refill.  Spine/Pelvis examination:  Inspection:  Overall, sacoiliac joint benign and hips nontender; without crepitus or defects.   Thoracic spine inspection: Alignment normal without kyphosis present   Lumbar spine inspection:  Alignment  with normal lumbar lordosis, without scoliosis apparent.   Thoracic spine palpation:  without tenderness of spinal processes   Lumbar spine palpation: without tenderness of lumbar area; without tightness of lumbar muscles    Range of Motion:   Lumbar flexion, forward flexion is normal without pain or tenderness    Lumbar extension is full without pain or tenderness   Left lateral bend is normal without pain or tenderness   Right lateral bend is normal without pain or tenderness   Straight leg raising is normal  Strength & tone: normal   Stability overall normal stability  Lymphatic: palpation is normal.  All other systems reviewed and are negative   The patient has been educated about the nature of the problem(s) and counseled on treatment options.  The patient appeared to understand what I have discussed and is in agreement with it.  Encounter Diagnosis  Name Primary?  . Low back pain radiating to right lower extremity Yes    PLAN Call if any problems.  Precautions discussed.  Continue current medications.   Return to clinic 6 weeks   Electronically Signed Sanjuana Kava, MD 3/19/20208:29 AM

## 2018-07-02 ENCOUNTER — Ambulatory Visit: Payer: Medicare Other | Admitting: Orthopaedic Surgery

## 2018-07-02 ENCOUNTER — Other Ambulatory Visit (HOSPITAL_COMMUNITY): Payer: Self-pay | Admitting: Pulmonary Disease

## 2018-07-02 DIAGNOSIS — Z09 Encounter for follow-up examination after completed treatment for conditions other than malignant neoplasm: Secondary | ICD-10-CM

## 2018-07-02 DIAGNOSIS — Z86718 Personal history of other venous thrombosis and embolism: Principal | ICD-10-CM

## 2018-07-02 DIAGNOSIS — G4733 Obstructive sleep apnea (adult) (pediatric): Secondary | ICD-10-CM | POA: Diagnosis not present

## 2018-07-02 DIAGNOSIS — C61 Malignant neoplasm of prostate: Secondary | ICD-10-CM | POA: Diagnosis not present

## 2018-07-02 DIAGNOSIS — G629 Polyneuropathy, unspecified: Secondary | ICD-10-CM | POA: Diagnosis not present

## 2018-07-02 DIAGNOSIS — M545 Low back pain: Secondary | ICD-10-CM | POA: Diagnosis not present

## 2018-07-04 ENCOUNTER — Encounter: Payer: Self-pay | Admitting: Orthopaedic Surgery

## 2018-07-04 ENCOUNTER — Ambulatory Visit (INDEPENDENT_AMBULATORY_CARE_PROVIDER_SITE_OTHER): Payer: Medicare Other | Admitting: Orthopaedic Surgery

## 2018-07-04 ENCOUNTER — Other Ambulatory Visit: Payer: Self-pay

## 2018-07-04 DIAGNOSIS — M545 Low back pain, unspecified: Secondary | ICD-10-CM

## 2018-07-04 NOTE — Progress Notes (Signed)
Virtual Visit via Telephone Note  I connected with Norman Blake on 07/04/18 at  9:40 AM EDT by telephone and verified that I am speaking with the correct person using two identifiers.   I discussed the limitations, risks, security and privacy concerns of performing an evaluation and management service by telephone and the availability of in person appointments. I also discussed with the patient that there may be a patient responsible charge related to this service. The patient expressed understanding and agreed to proceed.   History of Present Illness: He has lower back pain.  He has had some increased pain last week but is better today.  He is active.  He has no weakness, no new trauma.  He gets his medicine from the [place].  He says the weather irritates him also.   Observations/Objective: Per above.  Assessment and Plan: Encounter Diagnosis  Name Primary?  . Low back pain radiating to right lower extremity Yes     Follow Up Instructions: Two months virtual visit.   I discussed the assessment and treatment plan with the patient. The patient was provided an opportunity to ask questions and all were answered. The patient agreed with the plan and demonstrated an understanding of the instructions.   The patient was advised to call back or seek an in-person evaluation if the symptoms worsen or if the condition fails to improve as anticipated.  I provided 10 minutes of non-face-to-face time during this encounter.   Sanjuana Kava, MD

## 2018-07-11 DIAGNOSIS — M9901 Segmental and somatic dysfunction of cervical region: Secondary | ICD-10-CM | POA: Diagnosis not present

## 2018-07-11 DIAGNOSIS — S13160A Subluxation of C5/C6 cervical vertebrae, initial encounter: Secondary | ICD-10-CM | POA: Diagnosis not present

## 2018-07-23 DIAGNOSIS — S13160A Subluxation of C5/C6 cervical vertebrae, initial encounter: Secondary | ICD-10-CM | POA: Diagnosis not present

## 2018-07-23 DIAGNOSIS — M9901 Segmental and somatic dysfunction of cervical region: Secondary | ICD-10-CM | POA: Diagnosis not present

## 2018-08-19 ENCOUNTER — Ambulatory Visit (HOSPITAL_COMMUNITY)
Admission: RE | Admit: 2018-08-19 | Discharge: 2018-08-19 | Disposition: A | Discharge status: Home / Self Care | Payer: Medicare Other | Source: Ambulatory Visit | Attending: Pulmonary Disease | Admitting: Pulmonary Disease

## 2018-08-19 ENCOUNTER — Other Ambulatory Visit: Payer: Self-pay

## 2018-08-19 DIAGNOSIS — Z09 Encounter for follow-up examination after completed treatment for conditions other than malignant neoplasm: Secondary | ICD-10-CM | POA: Diagnosis not present

## 2018-08-19 DIAGNOSIS — Z86718 Personal history of other venous thrombosis and embolism: Secondary | ICD-10-CM | POA: Insufficient documentation

## 2018-08-19 DIAGNOSIS — R600 Localized edema: Secondary | ICD-10-CM | POA: Diagnosis not present

## 2018-08-26 DIAGNOSIS — R972 Elevated prostate specific antigen [PSA]: Secondary | ICD-10-CM | POA: Diagnosis not present

## 2018-09-02 DIAGNOSIS — R972 Elevated prostate specific antigen [PSA]: Secondary | ICD-10-CM | POA: Diagnosis not present

## 2018-09-02 DIAGNOSIS — N401 Enlarged prostate with lower urinary tract symptoms: Secondary | ICD-10-CM | POA: Diagnosis not present

## 2018-09-04 ENCOUNTER — Ambulatory Visit (INDEPENDENT_AMBULATORY_CARE_PROVIDER_SITE_OTHER): Payer: Medicare Other | Admitting: Orthopaedic Surgery

## 2018-09-04 ENCOUNTER — Encounter: Payer: Self-pay | Admitting: Orthopaedic Surgery

## 2018-09-04 ENCOUNTER — Other Ambulatory Visit: Payer: Self-pay

## 2018-09-04 DIAGNOSIS — M545 Low back pain: Secondary | ICD-10-CM | POA: Diagnosis not present

## 2018-09-04 DIAGNOSIS — M79604 Pain in right leg: Secondary | ICD-10-CM

## 2018-09-04 NOTE — Progress Notes (Signed)
Virtual Visit via Telephone Note  I connected with@ on 09/04/18 at  9:10 AM EDT by telephone and verified that I am speaking with the correct person using two identifiers.  Location: Patient: home Provider: home   I discussed the limitations, risks, security and privacy concerns of performing an evaluation and management service by telephone and the availability of in person appointments. I also discussed with the patient that there may be a patient responsible charge related to this service. The patient expressed understanding and agreed to proceed.   History of Present Illness: He is doing well with the lower back pain.  He has some good and bad days but no numbness.  He had eye surgery yesterday and is doing well from that.     Observations/Objective: Per above.  Assessment and Plan: Encounter Diagnosis  Name Primary?  . Low back pain radiating to right lower extremity Yes     Follow Up Instructions: Two months.  Can do by phone   I discussed the assessment and treatment plan with the patient. The patient was provided an opportunity to ask questions and all were answered. The patient agreed with the plan and demonstrated an understanding of the instructions.   The patient was advised to call back or seek an in-person evaluation if the symptoms worsen or if the condition fails to improve as anticipated.  I provided 6 minutes of non-face-to-face time during this encounter.   Sanjuana Kava, MD

## 2018-09-12 ENCOUNTER — Ambulatory Visit (INDEPENDENT_AMBULATORY_CARE_PROVIDER_SITE_OTHER): Payer: Medicare Other | Admitting: General Surgery

## 2018-09-12 ENCOUNTER — Other Ambulatory Visit: Payer: Self-pay

## 2018-09-12 ENCOUNTER — Encounter: Payer: Self-pay | Admitting: General Surgery

## 2018-09-12 VITALS — BP 126/77 | HR 67 | Temp 97.3°F | Resp 16 | Ht 75.0 in | Wt 257.0 lb

## 2018-09-12 DIAGNOSIS — M6208 Separation of muscle (nontraumatic), other site: Secondary | ICD-10-CM | POA: Diagnosis not present

## 2018-09-12 NOTE — Patient Instructions (Signed)
Diastasis Recti  Diastasis recti is when the muscles of the abdomen (rectus abdominis muscles) become thin and separate. The result is a wider space between the right and left abdomen (abdominal) muscles. This wider space between the muscles may cause a bulge in the middle of your abdomen. You may notice this bulge when you are straining or when you sit up from a lying down position. Diastasis recti can affect men and women. It is most common among pregnant women, infants, people who are obese, and people who have had abdominal surgery. Exercise or surgical treatment may help correct it. What are the causes? Common causes of this condition include:  Pregnancy. The growing uterus puts pressure on the abdominal muscles, which causes the muscles to separate.  Obesity. Excess fat puts pressure on abdominal muscles.  Weightlifting.  Some abdomen exercises.  Advanced age.  Genetics.  Prior abdominal surgery. What increases the risk? This condition is more likely to develop in:  Women.  Newborns, especially newborns who are born early (prematurely). What are the signs or symptoms? Common symptoms of this condition include:  A bulge in the middle of the abdomen. You will notice it most when you sit up or strain.  Pain in the low back, pelvis, or hips.  Constipation.  Inability to control when you urinate (urinary incontinence).  Bloating.  Poor posture. How is this diagnosed? This condition is diagnosed with a physical exam. Your health care provider will ask you to lie flat on your back and do a crunch or half sit-up. If you have diastasis recti, a vertical bulge will appear between your abdominal muscles in the center of your abdomen. Your health care provider will measure the gap between your muscles with one of the following:  A medical device used to measure the space between two objects (caliper).  A tape measure.  CT scan.  Ultrasound.  Finger spaces. Your health  care provider will measure the space using their fingers. How is this treated? If your muscle separation is not too large, you may not need treatment. However, if you are a woman who plans to become pregnant again, you should treat this condition before your next pregnancy. Treatment may include:  Physical therapy to strengthen and tighten your abdominal muscles.  Lifestyle changes such as weight loss and exercise.  Over-the-counter pain medicines as needed.  Surgery to correct the separation. Follow these instructions at home: Activity  Return to your normal activities as told by your health care provider. Ask your health care provider what activities are safe for you.  When lifting weights or doing exercises using your abdominal muscles or the muscles in the center of your body that give stability (core muscles), make sure you are doing your exercises and movements correctly. Proper form can help to prevent the condition from happening again. General instructions  If you are overweight, ask your health care provider for help with weight loss. Losing even a small amount of weight can help to improve your diastasis recti.  Take over-the-counter or prescription medicines only as told by your health care provider.  Do not strain. Straining can make the separation worse. Examples of straining include: ? Pushing hard to have a bowel movement, such as due to constipation. ? Lifting heavy objects, including children. ? Standing up and sitting down.  Take steps to prevent constipation: ? Drink enough fluid to keep your urine clear or pale yellow. ? Take over-the-counter or prescription medicines only as directed. ? Eat foods   that are high in fiber, such as fresh fruits and vegetables, whole grains, and beans. ? Limit foods that are high in fat and processed sugars, such as fried and sweet foods. Contact a health care provider if:  You notice a new bulge in your abdomen. Get help right  away if:  You experience severe discomfort in your abdomen.  You develop severe abdominal pain along with nausea, vomiting, or fever. Summary  Diastasis recti is when the abdomen (abdominal) muscles become thin and separate. Your abdomen will stick out because the space between your right and left abdomen muscles has widened.  The most common symptom is a bulge in your abdomen. You will notice it most when you sit up or are straining.  This condition is diagnosed during a physical exam.  If the abdomen separation is not too big, you may choose not to have treatment. Otherwise, you may need to undergo physical therapy or surgery. This information is not intended to replace advice given to you by your health care provider. Make sure you discuss any questions you have with your health care provider. Document Released: 05/01/2016 Document Revised: 05/01/2016 Document Reviewed: 05/01/2016 Elsevier Interactive Patient Education  2019 [employer]  

## 2018-09-12 NOTE — Progress Notes (Signed)
Norman Blake; [id number]; 11-Aug-1940   HPI Patient is a 78 year old white male who was referred to my care by Dr. Luan Pulling for evaluation treatment of a swelling along his abdomen.  He also want me to check some lipomas that he is felt in the past.  He has had lipomas removed from his arms in the past by myself.  He currently has 0 out of 10 pain.  He states he developed some swelling in the abdomen and was wondering if it was a hernia.  Is made worse when he sits up.  He denies any nausea or vomiting.  He has been taken off his anticoagulation due to a history of a DVT. Past Medical History:  Diagnosis Date  . Depression   . History of hiatal hernia   . 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 CHOLECYSTECTOMY    . COLONOSCOPY N/A 06/09/2014   Procedure: COLONOSCOPY;  Surgeon: Aviva Signs Md, MD;  Location: [facility];  Service: Gastroenterology;  Laterality: N/A;    Family History  Problem Relation Age of Onset  . Breast cancer Mother   . Cancer Father     Current Outpatient Medications on File Prior to Visit  Medication Sig Dispense Refill  . DULoxetine (CYMBALTA) 60 MG capsule Take 60 mg by mouth daily.    . finasteride (PROSCAR) 5 MG tablet Take 5 mg by mouth at bedtime.    Marland Kitchen latanoprost (XALATAN) 0.005 % ophthalmic solution Place 1 drop into both eyes at bedtime.    . Multiple Vitamin (MULTIVITAMIN) tablet Take 1 tablet by mouth daily.    . pregabalin (LYRICA) 50 MG capsule Take 50 mg by mouth 3 (three) times daily.     . simvastatin (ZOCOR) 80 MG tablet Take 40 mg by mouth at bedtime.    . tamsulosin (FLOMAX) 0.4 MG CAPS capsule Take 0.4 mg by mouth daily.    . timolol (TIMOPTIC-XR) 0.5 % ophthalmic gel-forming Place 1 drop into both eyes daily.    . traMADol (ULTRAM) 50 MG tablet Take 50 mg by mouth 4 (four) times daily.    . traZODone (DESYREL) 150 MG tablet Take 150  mg by mouth at bedtime.    . Rivaroxaban 15 & 20 MG TBPK Take as directed on package: Start with one 15mg  tablet by mouth twice a day with food. On Day 22, switch to one 20mg  tablet once a day with food. (Patient not taking: Reported on 09/12/2018) 51 each 0   No current facility-administered medications on file prior to visit.     Allergies  Allergen Reactions  . Atorvastatin Calcium Hives and Other (See Comments)  . Gabapentin Other (See Comments)    Social History   Substance and Sexual Activity  Alcohol Use No    Social History   Tobacco Use  Smoking Status Former Smoker  . Packs/day: 0.50  . Years: 3.00  . Pack years: 1.50  . Quit date: 06/09/1958  . Years since quitting: 60.3  Smokeless Tobacco Never Used    Review of Systems  Constitutional: Negative.   HENT: Negative.   Eyes: Negative.   Respiratory: Negative.   Cardiovascular: Negative.   Gastrointestinal: Negative.   Genitourinary: Negative.   Musculoskeletal: Positive for back pain, joint pain and neck pain.  Skin: Negative.   Neurological: Negative.   Endo/Heme/Allergies: Negative.   Psychiatric/Behavioral: Negative.  Objective   Vitals:   09/12/18 0951  BP: 126/77  Pulse: 67  Resp: 16  Temp: (!) 97.3 F (36.3 C)  SpO2: 92%    Physical Exam Vitals signs reviewed.  Constitutional:      Appearance: Normal appearance. He is obese. He is not ill-appearing.  HENT:     Head: Normocephalic and atraumatic.  Cardiovascular:     Rate and Rhythm: Normal rate and regular rhythm.     Heart sounds: Normal heart sounds. No murmur. No friction rub. No gallop.   Pulmonary:     Effort: Pulmonary effort is normal. No respiratory distress.     Breath sounds: Normal breath sounds. No stridor. No wheezing, rhonchi or rales.  Abdominal:     General: Bowel sounds are normal. There is no distension.     Palpations: Abdomen is soft. There is no mass.     Tenderness: There is no abdominal tenderness. There is  no guarding or rebound.     Hernia: No hernia is present.     Comments: Patient has large diastases recti present.  No discrete hernia is present.  No significant abdominal wall lipomas are palpated.  Skin:    General: Skin is warm and dry.  Neurological:     Mental Status: He is alert and oriented to person, place, and time.     Assessment  Diastases recti.  No hernia appreciated.  No significant lipomatous lesions present. Plan   I explained to the patient that he does not have a hernia of the abdominal wall, but the diastases recti.  No need for surgical intervention at this time.  Patient understands and agrees.  We will follow-up as needed.  Literature was given.

## 2018-09-26 DIAGNOSIS — M4800 Spinal stenosis, site unspecified: Secondary | ICD-10-CM | POA: Diagnosis not present

## 2018-09-26 DIAGNOSIS — C61 Malignant neoplasm of prostate: Secondary | ICD-10-CM | POA: Diagnosis not present

## 2018-09-26 DIAGNOSIS — G609 Hereditary and idiopathic neuropathy, unspecified: Secondary | ICD-10-CM | POA: Diagnosis not present

## 2018-09-26 DIAGNOSIS — F4310 Post-traumatic stress disorder, unspecified: Secondary | ICD-10-CM | POA: Diagnosis not present

## 2018-10-01 DIAGNOSIS — M9901 Segmental and somatic dysfunction of cervical region: Secondary | ICD-10-CM | POA: Diagnosis not present

## 2018-10-01 DIAGNOSIS — S13160A Subluxation of C5/C6 cervical vertebrae, initial encounter: Secondary | ICD-10-CM | POA: Diagnosis not present

## 2018-10-14 DIAGNOSIS — S13160A Subluxation of C5/C6 cervical vertebrae, initial encounter: Secondary | ICD-10-CM | POA: Diagnosis not present

## 2018-10-14 DIAGNOSIS — M9901 Segmental and somatic dysfunction of cervical region: Secondary | ICD-10-CM | POA: Diagnosis not present

## 2018-10-17 DIAGNOSIS — S13160A Subluxation of C5/C6 cervical vertebrae, initial encounter: Secondary | ICD-10-CM | POA: Diagnosis not present

## 2018-10-17 DIAGNOSIS — M9901 Segmental and somatic dysfunction of cervical region: Secondary | ICD-10-CM | POA: Diagnosis not present

## 2018-11-05 ENCOUNTER — Ambulatory Visit (INDEPENDENT_AMBULATORY_CARE_PROVIDER_SITE_OTHER): Payer: Medicare Other | Admitting: Orthopaedic Surgery

## 2018-11-05 ENCOUNTER — Encounter: Payer: Self-pay | Admitting: Orthopaedic Surgery

## 2018-11-05 ENCOUNTER — Other Ambulatory Visit: Payer: Self-pay

## 2018-11-05 VITALS — BP 121/82 | HR 53 | Temp 97.9°F | Ht 75.0 in | Wt 255.2 lb

## 2018-11-05 DIAGNOSIS — S332XXA Dislocation of sacroiliac and sacrococcygeal joint, initial encounter: Secondary | ICD-10-CM | POA: Diagnosis not present

## 2018-11-05 DIAGNOSIS — M9904 Segmental and somatic dysfunction of sacral region: Secondary | ICD-10-CM | POA: Diagnosis not present

## 2018-11-05 DIAGNOSIS — Z79899 Other long term (current) drug therapy: Secondary | ICD-10-CM | POA: Diagnosis not present

## 2018-11-05 DIAGNOSIS — M545 Low back pain, unspecified: Secondary | ICD-10-CM

## 2018-11-05 DIAGNOSIS — G4701 Insomnia due to medical condition: Secondary | ICD-10-CM | POA: Diagnosis not present

## 2018-11-05 DIAGNOSIS — R269 Unspecified abnormalities of gait and mobility: Secondary | ICD-10-CM | POA: Diagnosis not present

## 2018-11-05 DIAGNOSIS — G603 Idiopathic progressive neuropathy: Secondary | ICD-10-CM | POA: Diagnosis not present

## 2018-11-05 MED ORDER — PREDNISONE 5 MG (21) PO TBPK: ORAL_TABLET | ORAL | 0 refills | Status: DC

## 2018-11-05 NOTE — Progress Notes (Signed)
Patient Norman Blake, male DOB:1940/09/16, 78 y.o. UVO:[id number]  Chief Complaint  Patient presents with  . Back Pain    Pain on left side/rt side is better since the injection    HPI  Norman Blake is a 78 y.o. male who has lower back pain.  He is a little better.  He still has some pain that goes to the left knee area but not beyond.  He has no new trauma.  He is active. His eye surgery went well.  He has no weakness.   Body mass index is 31.9 kg/m.  ROS  Review of Systems  Constitutional: Positive for activity change.  Musculoskeletal: Positive for arthralgias, back pain and gait problem.  All other systems reviewed and are negative.   All other systems reviewed and are negative.  The following is a summary of the past history medically, past history surgically, known current medicines, social history and family history.  This information is gathered electronically by the computer from prior information and documentation.  I review this each visit and have found including this information at this point in the chart is beneficial and informative.    Past Medical History:  Diagnosis Date  . Depression   . History of hiatal hernia   . Neuromuscular disorder (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 CHOLECYSTECTOMY    . COLONOSCOPY N/A 06/09/2014   Procedure: COLONOSCOPY;  Surgeon: Aviva Signs Md, MD;  Location: [facility];  Service: Gastroenterology;  Laterality: N/A;    Family History  Problem Relation Age of Onset  . Breast cancer Mother   . Cancer Father     Social History Social History   Tobacco Use  . Smoking status: Former Smoker    Packs/day: 0.50    Years: 3.00    Pack years: 1.50    Quit date: 06/09/1958    Years since quitting: 60.4  . Smokeless tobacco: Never Used  Substance Use Topics  . Alcohol use: No  . Drug use: No    Allergies  Allergen  Reactions  . Atorvastatin Calcium Hives and Other (See Comments)  . Gabapentin Other (See Comments)    Current Outpatient Medications  Medication Sig Dispense Refill  . DULoxetine (CYMBALTA) 60 MG capsule Take 60 mg by mouth daily.    . finasteride (PROSCAR) 5 MG tablet Take 5 mg by mouth at bedtime.    Marland Kitchen latanoprost (XALATAN) 0.005 % ophthalmic solution Place 1 drop into both eyes at bedtime.    . Multiple Vitamin (MULTIVITAMIN) tablet Take 1 tablet by mouth daily.    . predniSONE (STERAPRED UNI-PAK 21 TAB) 5 MG (21) TBPK tablet Take 6 pills first day; 5 pills second day; 4 pills third day; 3 pills fourth day; 2 pills next day and 1 pill last day. 21 tablet 0  . pregabalin (LYRICA) 50 MG capsule Take 50 mg by mouth 3 (three) times daily.     . Rivaroxaban 15 & 20 MG TBPK Take as directed on package: Start with one 15mg  tablet by mouth twice a day with food. On Day 22, switch to one 20mg  tablet once a day with food. (Patient not taking: Reported on 09/12/2018) 51 each 0  . simvastatin (ZOCOR) 80 MG tablet Take 40 mg by mouth at bedtime.    . tamsulosin (FLOMAX) 0.4 MG CAPS capsule Take 0.4 mg by mouth daily.    Marland Kitchen  timolol (TIMOPTIC-XR) 0.5 % ophthalmic gel-forming Place 1 drop into both eyes daily.    . traMADol (ULTRAM) 50 MG tablet Take 50 mg by mouth 4 (four) times daily.    . traZODone (DESYREL) 150 MG tablet Take 150 mg by mouth at bedtime.     No current facility-administered medications for this visit.      Physical Exam  Blood pressure 121/82, pulse (!) 53, temperature 97.9 F (36.6 C), height 6\' 3"  (1.905 m), weight 255 lb 4 oz (115.8 kg).  Constitutional: overall normal hygiene, normal nutrition, well developed, normal grooming, normal body habitus. Assistive device:none  Musculoskeletal: gait and station Limp none, muscle tone and strength are normal, no tremors or atrophy is present.  .  Neurological: coordination overall normal.  Deep tendon reflex/nerve stretch intact.   Sensation normal.  Cranial nerves II-XII intact.   Skin:   Normal overall no scars, lesions, ulcers or rashes. No psoriasis.  Psychiatric: Alert and oriented x 3.  Recent memory intact, remote memory unclear.  Normal mood and affect. Well groomed.  Good eye contact.  Cardiovascular: overall no swelling, no varicosities, no edema bilaterally, normal temperatures of the legs and arms, no clubbing, cyanosis and good capillary refill.  Spine/Pelvis examination:  Inspection:  Overall, sacoiliac joint benign and hips nontender; without crepitus or defects.   Thoracic spine inspection: Alignment normal without kyphosis present   Lumbar spine inspection:  Alignment  with normal lumbar lordosis, without scoliosis apparent.   Thoracic spine palpation:  without tenderness of spinal processes   Lumbar spine palpation: without tenderness of lumbar area; without tightness of lumbar muscles    Range of Motion:   Lumbar flexion, forward flexion is normal without pain or tenderness    Lumbar extension is full without pain or tenderness   Left lateral bend is normal without pain or tenderness   Right lateral bend is normal without pain or tenderness   Straight leg raising is normal  Strength & tone: normal   Stability overall normal stability  Lymphatic: palpation is normal.  All other systems reviewed and are negative   The patient has been educated about the nature of the problem(s) and counseled on treatment options.  The patient appeared to understand what I have discussed and is in agreement with it.  Encounter Diagnosis  Name Primary?  . Low back pain radiating to right lower extremity Yes    PLAN Call if any problems.  Precautions discussed.  Continue current medications.   Return to clinic 3 months   I have reviewed the [place] web site prior to prescribing narcotic medicine for this patient.   Electronically Signed Sanjuana Kava,  MD 8/18/20208:42 AM

## 2018-12-25 DIAGNOSIS — M9904 Segmental and somatic dysfunction of sacral region: Secondary | ICD-10-CM | POA: Diagnosis not present

## 2018-12-25 DIAGNOSIS — S332XXA Dislocation of sacroiliac and sacrococcygeal joint, initial encounter: Secondary | ICD-10-CM | POA: Diagnosis not present

## 2018-12-31 DIAGNOSIS — E6609 Other obesity due to excess calories: Secondary | ICD-10-CM | POA: Diagnosis not present

## 2018-12-31 DIAGNOSIS — G47429 Narcolepsy in conditions classified elsewhere without cataplexy: Secondary | ICD-10-CM | POA: Diagnosis not present

## 2018-12-31 DIAGNOSIS — N400 Enlarged prostate without lower urinary tract symptoms: Secondary | ICD-10-CM | POA: Diagnosis not present

## 2018-12-31 DIAGNOSIS — F4310 Post-traumatic stress disorder, unspecified: Secondary | ICD-10-CM | POA: Diagnosis not present

## 2018-12-31 DIAGNOSIS — H4010X0 Unspecified open-angle glaucoma, stage unspecified: Secondary | ICD-10-CM | POA: Diagnosis not present

## 2018-12-31 DIAGNOSIS — G9009 Other idiopathic peripheral autonomic neuropathy: Secondary | ICD-10-CM | POA: Diagnosis not present

## 2018-12-31 DIAGNOSIS — G4700 Insomnia, unspecified: Secondary | ICD-10-CM | POA: Diagnosis not present

## 2018-12-31 DIAGNOSIS — E782 Mixed hyperlipidemia: Secondary | ICD-10-CM | POA: Diagnosis not present

## 2018-12-31 DIAGNOSIS — R202 Paresthesia of skin: Secondary | ICD-10-CM | POA: Diagnosis not present

## 2019-01-14 DIAGNOSIS — E785 Hyperlipidemia, unspecified: Secondary | ICD-10-CM | POA: Diagnosis not present

## 2019-01-14 DIAGNOSIS — Z0189 Encounter for other specified special examinations: Secondary | ICD-10-CM | POA: Diagnosis not present

## 2019-01-14 DIAGNOSIS — Z1329 Encounter for screening for other suspected endocrine disorder: Secondary | ICD-10-CM | POA: Diagnosis not present

## 2019-01-21 DIAGNOSIS — E6609 Other obesity due to excess calories: Secondary | ICD-10-CM | POA: Diagnosis not present

## 2019-01-21 DIAGNOSIS — G9009 Other idiopathic peripheral autonomic neuropathy: Secondary | ICD-10-CM | POA: Diagnosis not present

## 2019-01-21 DIAGNOSIS — R202 Paresthesia of skin: Secondary | ICD-10-CM | POA: Diagnosis not present

## 2019-01-21 DIAGNOSIS — G47429 Narcolepsy in conditions classified elsewhere without cataplexy: Secondary | ICD-10-CM | POA: Diagnosis not present

## 2019-01-21 DIAGNOSIS — G4700 Insomnia, unspecified: Secondary | ICD-10-CM | POA: Diagnosis not present

## 2019-01-21 DIAGNOSIS — N400 Enlarged prostate without lower urinary tract symptoms: Secondary | ICD-10-CM | POA: Diagnosis not present

## 2019-01-21 DIAGNOSIS — F4310 Post-traumatic stress disorder, unspecified: Secondary | ICD-10-CM | POA: Diagnosis not present

## 2019-01-21 DIAGNOSIS — Z0000 Encounter for general adult medical examination without abnormal findings: Secondary | ICD-10-CM | POA: Diagnosis not present

## 2019-01-21 DIAGNOSIS — H4010X0 Unspecified open-angle glaucoma, stage unspecified: Secondary | ICD-10-CM | POA: Diagnosis not present

## 2019-01-21 DIAGNOSIS — E782 Mixed hyperlipidemia: Secondary | ICD-10-CM | POA: Diagnosis not present

## 2019-02-04 ENCOUNTER — Encounter: Payer: Self-pay | Admitting: Orthopaedic Surgery

## 2019-02-04 ENCOUNTER — Other Ambulatory Visit: Payer: Self-pay

## 2019-02-04 ENCOUNTER — Ambulatory Visit (INDEPENDENT_AMBULATORY_CARE_PROVIDER_SITE_OTHER): Payer: Medicare Other | Admitting: Orthopaedic Surgery

## 2019-02-04 VITALS — BP 103/64 | HR 59 | Temp 97.7°F | Ht 75.0 in | Wt 251.0 lb

## 2019-02-04 DIAGNOSIS — M545 Low back pain: Secondary | ICD-10-CM | POA: Diagnosis not present

## 2019-02-04 DIAGNOSIS — M79604 Pain in right leg: Secondary | ICD-10-CM

## 2019-02-04 NOTE — Progress Notes (Signed)
Patient Norman Blake, male DOB:10-13-1940, 78 y.o. [id number]  Chief Complaint  Patient presents with  . Back Pain    Low back pain.    HPI  Norman Blake is a 78 y.o. male who has chronic lower back pain.  He has no weakness, no trauma.  He has more pain with cold rainy weather.  He has been active, taking his medicine and doing his exercises.  He recently had eye surgery.   Body mass index is 31.37 kg/m.  ROS  Review of Systems  Constitutional: Positive for activity change.  Musculoskeletal: Positive for arthralgias, back pain and gait problem.  All other systems reviewed and are negative.   All other systems reviewed and are negative.  The following is a summary of the past history medically, past history surgically, known current medicines, social history and family history.  This information is gathered electronically by the computer from prior information and documentation.  I review this each visit and have found including this information at this point in the chart is beneficial and informative.    Past Medical History:  Diagnosis Date  . Depression   . History of hiatal hernia   . 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 CHOLECYSTECTOMY    . COLONOSCOPY N/A 06/09/2014   Procedure: COLONOSCOPY;  Surgeon: Aviva Signs Md, MD;  Location: [facility];  Service: Gastroenterology;  Laterality: N/A;    Family History  Problem Relation Age of Onset  . Breast cancer Mother   . Cancer Father     Social History Social History   Tobacco Use  . Smoking status: Former Smoker    Packs/day: 0.50    Years: 3.00    Pack years: 1.50    Quit date: 06/09/1958    Years since quitting: 60.6  . Smokeless tobacco: Never Used  Substance Use Topics  . Alcohol use: No  . Drug use: No    Allergies  Allergen Reactions  . Atorvastatin Calcium Hives and Other  (See Comments)  . Gabapentin Other (See Comments)    Current Outpatient Medications  Medication Sig Dispense Refill  . DULoxetine (CYMBALTA) 60 MG capsule Take 60 mg by mouth daily.    . finasteride (PROSCAR) 5 MG tablet Take 5 mg by mouth at bedtime.    Marland Kitchen latanoprost (XALATAN) 0.005 % ophthalmic solution Place 1 drop into both eyes at bedtime.    . Multiple Vitamin (MULTIVITAMIN) tablet Take 1 tablet by mouth daily.    . predniSONE (STERAPRED UNI-PAK 21 TAB) 5 MG (21) TBPK tablet Take 6 pills first day; 5 pills second day; 4 pills third day; 3 pills fourth day; 2 pills next day and 1 pill last day. 21 tablet 0  . pregabalin (LYRICA) 50 MG capsule Take 50 mg by mouth 3 (three) times daily.     . Rivaroxaban 15 & 20 MG TBPK Take as directed on package: Start with one 15mg  tablet by mouth twice a day with food. On Day 22, switch to one 20mg  tablet once a day with food. (Patient not taking: Reported on 09/12/2018) 51 each 0  . simvastatin (ZOCOR) 80 MG tablet Take 40 mg by mouth at bedtime.    . tamsulosin (FLOMAX) 0.4 MG CAPS capsule Take 0.4 mg by mouth daily.    . timolol (TIMOPTIC-XR) 0.5 % ophthalmic gel-forming Place 1 drop into both eyes  daily.    . traMADol (ULTRAM) 50 MG tablet Take 50 mg by mouth 4 (four) times daily.    . traZODone (DESYREL) 150 MG tablet Take 150 mg by mouth at bedtime.     No current facility-administered medications for this visit.      Physical Exam  Blood pressure 103/64, pulse (!) 59, temperature 97.7 F (36.5 C), height 6\' 3"  (1.905 m), weight 251 lb (113.9 kg).  Constitutional: overall normal hygiene, normal nutrition, well developed, normal grooming, normal body habitus. Assistive device:none  Musculoskeletal: gait and station Limp none, muscle tone and strength are normal, no tremors or atrophy is present.  .  Neurological: coordination overall normal.  Deep tendon reflex/nerve stretch intact.  Sensation normal.  Cranial nerves II-XII intact.    Skin:   Normal overall no scars, lesions, ulcers or rashes. No psoriasis.  Psychiatric: Alert and oriented x 3.  Recent memory intact, remote memory unclear.  Normal mood and affect. Well groomed.  Good eye contact.  Cardiovascular: overall no swelling, no varicosities, no edema bilaterally, normal temperatures of the legs and arms, no clubbing, cyanosis and good capillary refill.  Lymphatic: palpation is normal.  Spine/Pelvis examination:  Inspection:  Overall, sacoiliac joint benign and hips nontender; without crepitus or defects.   Thoracic spine inspection: Alignment normal without kyphosis present   Lumbar spine inspection:  Alignment  with normal lumbar lordosis, without scoliosis apparent.   Thoracic spine palpation:  without tenderness of spinal processes   Lumbar spine palpation: without tenderness of lumbar area; without tightness of lumbar muscles    Range of Motion:   Lumbar flexion, forward flexion is normal without pain or tenderness    Lumbar extension is full without pain or tenderness   Left lateral bend is normal without pain or tenderness   Right lateral bend is normal without pain or tenderness   Straight leg raising is normal  Strength & tone: normal   Stability overall normal stability  All other systems reviewed and are negative   The patient has been educated about the nature of the problem(s) and counseled on treatment options.  The patient appeared to understand what I have discussed and is in agreement with it.  Encounter Diagnosis  Name Primary?  . Low back pain radiating to right lower extremity Yes    PLAN Call if any problems.  Precautions discussed.  Continue current medications.   Return to clinic 2 months   Electronically Signed Sanjuana Kava, MD 11/17/20208:56 AM

## 2019-02-12 ENCOUNTER — Other Ambulatory Visit: Payer: Self-pay

## 2019-02-12 DIAGNOSIS — M9904 Segmental and somatic dysfunction of sacral region: Secondary | ICD-10-CM | POA: Diagnosis not present

## 2019-02-12 DIAGNOSIS — S332XXA Dislocation of sacroiliac and sacrococcygeal joint, initial encounter: Secondary | ICD-10-CM | POA: Diagnosis not present

## 2019-03-03 DIAGNOSIS — R972 Elevated prostate specific antigen [PSA]: Secondary | ICD-10-CM | POA: Diagnosis not present

## 2019-03-03 DIAGNOSIS — N401 Enlarged prostate with lower urinary tract symptoms: Secondary | ICD-10-CM | POA: Diagnosis not present

## 2019-03-03 DIAGNOSIS — R351 Nocturia: Secondary | ICD-10-CM | POA: Diagnosis not present

## 2019-04-02 ENCOUNTER — Telehealth: Payer: Self-pay | Admitting: Orthopaedic Surgery

## 2019-04-02 NOTE — Telephone Encounter (Signed)
Patient's wife Lelon Frohlich called in stating that Norman Blake leg was hurting him.  She said that it was "hurting like it did last year when he had the blood clot".  I told her that Dr. Luna Glasgow was not in the office today but that I could send him a message or Mr. Bielby could come in to see Dr. Luna Glasgow tomorrow morning.  She said he had an appointment tomorrow at the [facility] to get his COVID vaccine and couldn't miss getting that.  I suggested that I would go ahead and send a message to Dr. Luna Glasgow but she told me to hold off on that.  She said she would check in with the PCP,  Dr Nevada Crane.

## 2019-04-03 NOTE — Telephone Encounter (Signed)
I spoke back to Mr. And Mrs Blandin.  She said they did speak to Dr. Juel Burrow office yesterday and there has been an order for a doppler done for him.  He will go for the doppler tomorrow morning.

## 2019-04-03 NOTE — Telephone Encounter (Signed)
I called and left messages on the home and cell phone numbers for both Norman Blake and Norman Blake.

## 2019-04-03 NOTE — Telephone Encounter (Signed)
Make sure he saw his PCP.  If not, get a stat doppler study today.

## 2019-04-04 ENCOUNTER — Ambulatory Visit (HOSPITAL_COMMUNITY)
Admission: RE | Admit: 2019-04-04 | Discharge: 2019-04-04 | Disposition: A | Discharge status: Home / Self Care | Payer: Medicare Other | Source: Ambulatory Visit | Attending: Internal Medicine | Admitting: Internal Medicine

## 2019-04-04 ENCOUNTER — Other Ambulatory Visit: Payer: Self-pay | Admitting: Internal Medicine

## 2019-04-04 ENCOUNTER — Other Ambulatory Visit: Payer: Self-pay

## 2019-04-04 DIAGNOSIS — M79661 Pain in right lower leg: Secondary | ICD-10-CM

## 2019-04-04 NOTE — Telephone Encounter (Signed)
Thanks

## 2019-04-08 ENCOUNTER — Encounter: Payer: Self-pay | Admitting: Orthopaedic Surgery

## 2019-04-08 ENCOUNTER — Ambulatory Visit (INDEPENDENT_AMBULATORY_CARE_PROVIDER_SITE_OTHER): Payer: Medicare Other | Admitting: Orthopaedic Surgery

## 2019-04-08 ENCOUNTER — Other Ambulatory Visit: Payer: Self-pay

## 2019-04-08 VITALS — BP 142/87 | HR 57 | Temp 97.9°F | Ht 75.0 in | Wt 251.5 lb

## 2019-04-08 DIAGNOSIS — M545 Low back pain, unspecified: Secondary | ICD-10-CM

## 2019-04-08 DIAGNOSIS — M25561 Pain in right knee: Secondary | ICD-10-CM | POA: Diagnosis not present

## 2019-04-08 DIAGNOSIS — R600 Localized edema: Secondary | ICD-10-CM | POA: Diagnosis not present

## 2019-04-08 DIAGNOSIS — G8929 Other chronic pain: Secondary | ICD-10-CM | POA: Diagnosis not present

## 2019-04-08 NOTE — Progress Notes (Signed)
Patient YH:[id number] Norman Blake, male DOB:Oct 05, 1940, 79 y.o. [id number]  Chief Complaint  Patient presents with  . Knee Pain    R/hurting    HPI  Norman Blake is a 79 y.o. male who has had pain in the right lower leg and swelling. He had Doppler study last week which was negative.  He has support hose but they are too tight for him.  I will have him go to the [place] for new fitting.    He has right knee pain too with some swelling and some popping but no giving way.  He has no trauma, no redness.  He declines injection.  His lower back has good and bad days.  He has no weakness, no new trauma.  He is taking his medicine.   Body mass index is 31.44 kg/m.  ROS  Review of Systems  Constitutional: Positive for activity change.  Musculoskeletal: Positive for arthralgias, back pain and gait problem.  All other systems reviewed and are negative.   All other systems reviewed and are negative.  The following is a summary of the past history medically, past history surgically, known current medicines, social history and family history.  This information is gathered electronically by the computer from prior information and documentation.  I review this each visit and have found including this information at this point in the chart is beneficial and informative.    Past Medical History:  Diagnosis Date  . Depression   . History of hiatal hernia   . 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 CHOLECYSTECTOMY    . COLONOSCOPY N/A 06/09/2014   Procedure: COLONOSCOPY;  Surgeon: Aviva Signs Md, MD;  Location: [facility];  Service: Gastroenterology;  Laterality: N/A;    Family History  Problem Relation Age of Onset  . Breast cancer Mother   . Cancer Father     Social History Social History   Tobacco Use  . Smoking status: Former Smoker    Packs/day: 0.50   Years: 3.00    Pack years: 1.50    Quit date: 06/09/1958    Years since quitting: 60.8  . Smokeless tobacco: Never Used  Substance Use Topics  . Alcohol use: No  . Drug use: No    Allergies  Allergen Reactions  . Atorvastatin Calcium Hives and Other (See Comments)  . Gabapentin Other (See Comments)    Current Outpatient Medications  Medication Sig Dispense Refill  . DULoxetine (CYMBALTA) 60 MG capsule Take 60 mg by mouth daily.    . finasteride (PROSCAR) 5 MG tablet Take 5 mg by mouth at bedtime.    Marland Kitchen latanoprost (XALATAN) 0.005 % ophthalmic solution Place 1 drop into both eyes at bedtime.    . Multiple Vitamin (MULTIVITAMIN) tablet Take 1 tablet by mouth daily.    . predniSONE (STERAPRED UNI-PAK 21 TAB) 5 MG (21) TBPK tablet Take 6 pills first day; 5 pills second day; 4 pills third day; 3 pills fourth day; 2 pills next day and 1 pill last day. 21 tablet 0  . pregabalin (LYRICA) 50 MG capsule Take 50 mg by mouth 3 (three) times daily.     . Rivaroxaban 15 & 20 MG TBPK Take as directed on package: Start with one 15mg  tablet by mouth twice a day with food. On Day 22, switch to one 20mg  tablet once a day with food. (Patient  not taking: Reported on 09/12/2018) 51 each 0  . simvastatin (ZOCOR) 80 MG tablet Take 40 mg by mouth at bedtime.    . tamsulosin (FLOMAX) 0.4 MG CAPS capsule Take 0.4 mg by mouth daily.    . timolol (TIMOPTIC-XR) 0.5 % ophthalmic gel-forming Place 1 drop into both eyes daily.    . traMADol (ULTRAM) 50 MG tablet Take 50 mg by mouth 4 (four) times daily.    . traZODone (DESYREL) 150 MG tablet Take 150 mg by mouth at bedtime.     No current facility-administered medications for this visit.     Physical Exam  Blood pressure (!) 142/87, pulse (!) 57, temperature 97.9 F (36.6 C), height 6\' 3"  (1.905 m), weight 251 lb 8 oz (114.1 kg).  Constitutional: overall normal hygiene, normal nutrition, well developed, normal grooming, normal body habitus. Assistive  device:support hose  Musculoskeletal: gait and station Limp right, muscle tone and strength are normal, no tremors or atrophy is present.  .  Neurological: coordination overall normal.  Deep tendon reflex/nerve stretch intact.  Sensation normal.  Cranial nerves II-XII intact.   Skin:   Normal overall no scars, lesions, ulcers or rashes. No psoriasis.  Psychiatric: Alert and oriented x 3.  Recent memory intact, remote memory unclear.  Normal mood and affect. Well groomed.  Good eye contact.  Cardiovascular: overall no swelling, no varicosities, no edema bilaterally, normal temperatures of the legs and arms, no clubbing, cyanosis and good capillary refill.  Lymphatic: palpation is normal.  Spine/Pelvis examination:  Inspection:  Overall, sacoiliac joint benign and hips nontender; without crepitus or defects.   Thoracic spine inspection: Alignment normal without kyphosis present   Lumbar spine inspection:  Alignment  with normal lumbar lordosis, without scoliosis apparent.   Thoracic spine palpation:  without tenderness of spinal processes   Lumbar spine palpation: without tenderness of lumbar area; without tightness of lumbar muscles    Range of Motion:   Lumbar flexion, forward flexion is normal without pain or tenderness    Lumbar extension is full without pain or tenderness   Left lateral bend is normal without pain or tenderness   Right lateral bend is normal without pain or tenderness   Straight leg raising is normal  Strength & tone: normal   Stability overall normal stability  His right knee is tender, ROM 0 to 105, crepitus, slight effusion, limp right, stable, NV intact.  He has support hose in place.  It is tight and I think a new one with 15 mm/Hg would be sufficient.  NV intact.  He has no edema today as he has had his hose on since he got up a short time ago. All other systems reviewed and are negative   The patient has been educated about the nature of the  problem(s) and counseled on treatment options.  The patient appeared to understand what I have discussed and is in agreement with it.  Encounter Diagnoses  Name Primary?  . Low back pain radiating to right lower extremity Yes  . Chronic pain of right knee   . Edema of both lower legs     PLAN Call if any problems.  Precautions discussed.  Continue current medications.   Return to clinic 1 month   Get fitted with new support hose.  Electronically Signed Sanjuana Kava, MD 1/19/20218:42 AM

## 2019-05-06 ENCOUNTER — Ambulatory Visit: Payer: Medicare Other | Admitting: Orthopaedic Surgery

## 2019-05-12 DIAGNOSIS — Z79899 Other long term (current) drug therapy: Secondary | ICD-10-CM | POA: Diagnosis not present

## 2019-05-12 DIAGNOSIS — G4701 Insomnia due to medical condition: Secondary | ICD-10-CM | POA: Diagnosis not present

## 2019-05-12 DIAGNOSIS — G603 Idiopathic progressive neuropathy: Secondary | ICD-10-CM | POA: Diagnosis not present

## 2019-05-12 DIAGNOSIS — M9904 Segmental and somatic dysfunction of sacral region: Secondary | ICD-10-CM | POA: Diagnosis not present

## 2019-05-12 DIAGNOSIS — R269 Unspecified abnormalities of gait and mobility: Secondary | ICD-10-CM | POA: Diagnosis not present

## 2019-05-12 DIAGNOSIS — S332XXA Dislocation of sacroiliac and sacrococcygeal joint, initial encounter: Secondary | ICD-10-CM | POA: Diagnosis not present

## 2019-05-13 ENCOUNTER — Other Ambulatory Visit: Payer: Self-pay

## 2019-05-13 ENCOUNTER — Ambulatory Visit (INDEPENDENT_AMBULATORY_CARE_PROVIDER_SITE_OTHER): Payer: Medicare Other | Admitting: Orthopaedic Surgery

## 2019-05-13 ENCOUNTER — Encounter: Payer: Self-pay | Admitting: Orthopaedic Surgery

## 2019-05-13 VITALS — Ht 75.0 in | Wt 240.0 lb

## 2019-05-13 DIAGNOSIS — M5136 Other intervertebral disc degeneration, lumbar region: Secondary | ICD-10-CM | POA: Diagnosis not present

## 2019-05-13 NOTE — Progress Notes (Signed)
Patient Norman Blake:[id number] Norman Blake, male DOB:[birth year], 79 y.o. [id number]  Chief Complaint  Patient presents with  . Back Pain  . Leg Pain    HPI  Norman Blake is a 79 y.o. male who has chronic lower back pain.  He has more pain on the left side today.  He has no new trauma.  The cold weather makes him worse.  He saw the chiropractor yesterday.  He has no weakness.   Body mass index is 30 kg/m.  ROS  Review of Systems  Constitutional: Positive for activity change.  Musculoskeletal: Positive for arthralgias, back pain and gait problem.  All other systems reviewed and are negative.   All other systems reviewed and are negative.  The following is a summary of the past history medically, past history surgically, known current medicines, social history and family history.  This information is gathered electronically by the computer from prior information and documentation.  I review this each visit and have found including this information at this point in the chart is beneficial and informative.    Past Medical History:  Diagnosis Date  . Depression   . History of hiatal hernia   . 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 CHOLECYSTECTOMY    . COLONOSCOPY N/A 06/09/2014   Procedure: COLONOSCOPY;  Surgeon: Aviva Signs Md, MD;  Location: [facility];  Service: Gastroenterology;  Laterality: N/A;    Family History  Problem Relation Age of Onset  . Breast cancer Mother   . Cancer Father     Social History Social History   Tobacco Use  . Smoking status: Former Smoker    Packs/day: 0.50    Years: 3.00    Pack years: 1.50    Quit date: 06/09/1958    Years since quitting: 60.9  . Smokeless tobacco: Never Used  Substance Use Topics  . Alcohol use: No  . Drug use: No    Allergies  Allergen Reactions  . Atorvastatin Calcium Hives and Other (See Comments)  .  Gabapentin Other (See Comments)    Current Outpatient Medications  Medication Sig Dispense Refill  . DULoxetine (CYMBALTA) 60 MG capsule Take 60 mg by mouth daily.    . finasteride (PROSCAR) 5 MG tablet Take 5 mg by mouth at bedtime.    Marland Kitchen latanoprost (XALATAN) 0.005 % ophthalmic solution Place 1 drop into both eyes at bedtime.    . Multiple Vitamin (MULTIVITAMIN) tablet Take 1 tablet by mouth daily.    . predniSONE (STERAPRED UNI-PAK 21 TAB) 5 MG (21) TBPK tablet Take 6 pills first day; 5 pills second day; 4 pills third day; 3 pills fourth day; 2 pills next day and 1 pill last day. 21 tablet 0  . pregabalin (LYRICA) 50 MG capsule Take 50 mg by mouth 3 (three) times daily.     . Rivaroxaban 15 & 20 MG TBPK Take as directed on package: Start with one 15mg  tablet by mouth twice a day with food. On Day 22, switch to one 20mg  tablet once a day with food. (Patient not taking: Reported on 09/12/2018) 51 each 0  . simvastatin (ZOCOR) 80 MG tablet Take 40 mg by mouth at bedtime.    . tamsulosin (FLOMAX) 0.4 MG CAPS capsule Take 0.4 mg by mouth daily.    . timolol (TIMOPTIC-XR) 0.5 % ophthalmic gel-forming Place 1 drop into both eyes daily.    Marland Kitchen  traMADol (ULTRAM) 50 MG tablet Take 50 mg by mouth 4 (four) times daily.    . traZODone (DESYREL) 150 MG tablet Take 150 mg by mouth at bedtime.     No current facility-administered medications for this visit.     Physical Exam  Height 6\' 3"  (1.905 m), weight 240 Norman Blake (108.9 kg).  Constitutional: overall normal hygiene, normal nutrition, well developed, normal grooming, normal body habitus. Assistive device:cane  Musculoskeletal: gait and station Limp left, muscle tone and strength are normal, no tremors or atrophy is present.  .  Neurological: coordination overall normal.  Deep tendon reflex/nerve stretch intact.  Sensation normal.  Cranial nerves II-XII intact.   Skin:   Normal overall no scars, lesions, ulcers or rashes. No psoriasis.  Psychiatric:  Alert and oriented x 3.  Recent memory intact, remote memory unclear.  Normal mood and affect. Well groomed.  Good eye contact.  Cardiovascular: overall no swelling, no varicosities, no edema bilaterally, normal temperatures of the legs and arms, no clubbing, cyanosis and good capillary refill.  Lymphatic: palpation is normal.  Spine/Pelvis examination:  Inspection:  Overall, sacoiliac joint benign and hips nontender; without crepitus or defects.   Thoracic spine inspection: Alignment normal without kyphosis present   Lumbar spine inspection:  Alignment  with normal lumbar lordosis, without scoliosis apparent.   Thoracic spine palpation:  without tenderness of spinal processes   Lumbar spine palpation: without tenderness of lumbar area; without tightness of lumbar muscles    Range of Motion:   Lumbar flexion, forward flexion is normal without pain or tenderness    Lumbar extension is full without pain or tenderness   Left lateral bend is normal without pain or tenderness   Right lateral bend is normal without pain or tenderness   Straight leg raising is normal  Strength & tone: normal   Stability overall normal stability  All other systems reviewed and are negative   The patient has been educated about the nature of the problem(s) and counseled on treatment options.  The patient appeared to understand what I have discussed and is in agreement with it.  Encounter Diagnosis  Name Primary?  . Other intervertebral disc degeneration, lumbar region Yes    PLAN Call if any problems.  Precautions discussed.  Continue current medications.   Return to clinic 2 months   Electronically Signed Sanjuana Kava, MD 2/23/20218:58 AM

## 2019-07-03 DIAGNOSIS — M9904 Segmental and somatic dysfunction of sacral region: Secondary | ICD-10-CM | POA: Diagnosis not present

## 2019-07-03 DIAGNOSIS — S332XXA Dislocation of sacroiliac and sacrococcygeal joint, initial encounter: Secondary | ICD-10-CM | POA: Diagnosis not present

## 2019-07-04 IMAGING — XA Imaging study
2 series · 2 of 2 positions shown · non-contrast
Comparison: none

CLINICAL DATA: Lumbosacral spondylosis without myelopathy

[Series 1: ortho adipose · 1 of 1 slices shown (1 of 2)]
[im 1/1]
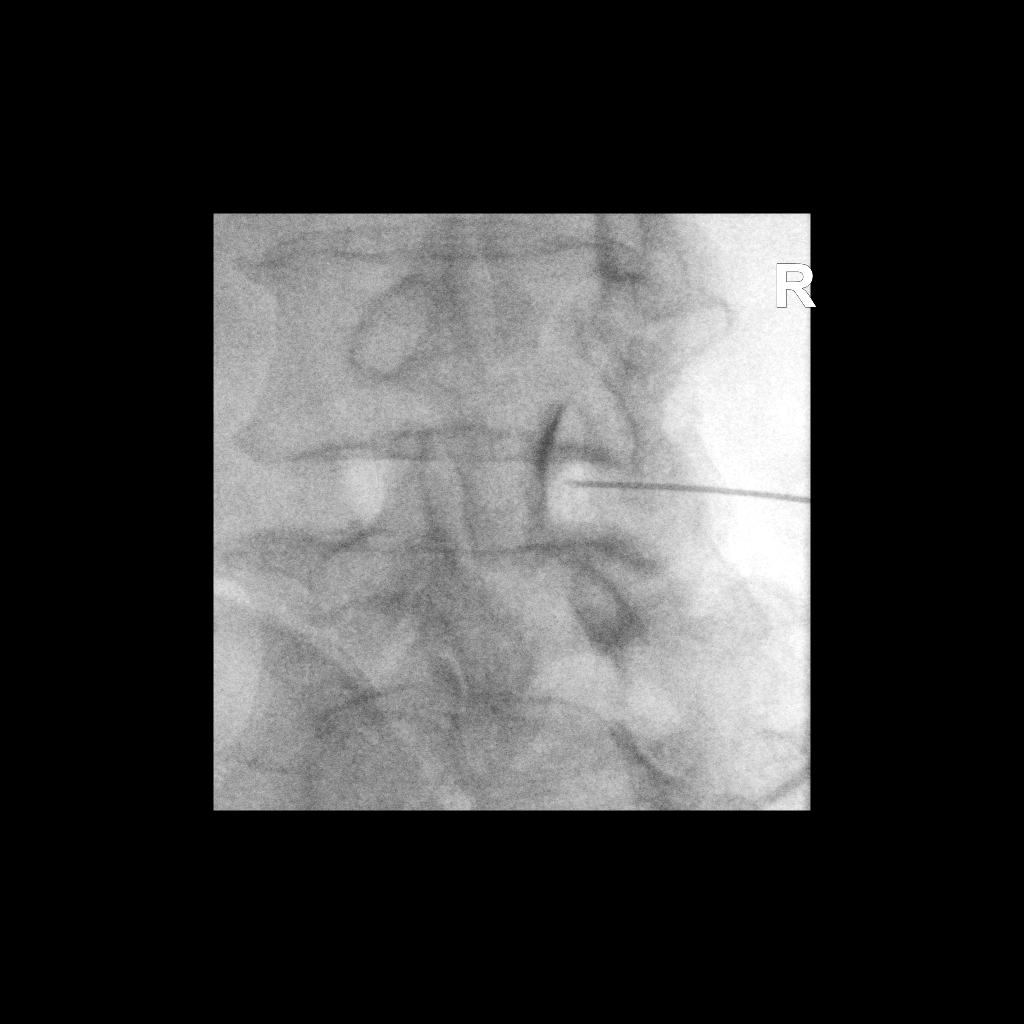

[Series 2: ortho adipose · 1 of 1 slices shown (2 of 2)]
[im 1/1]
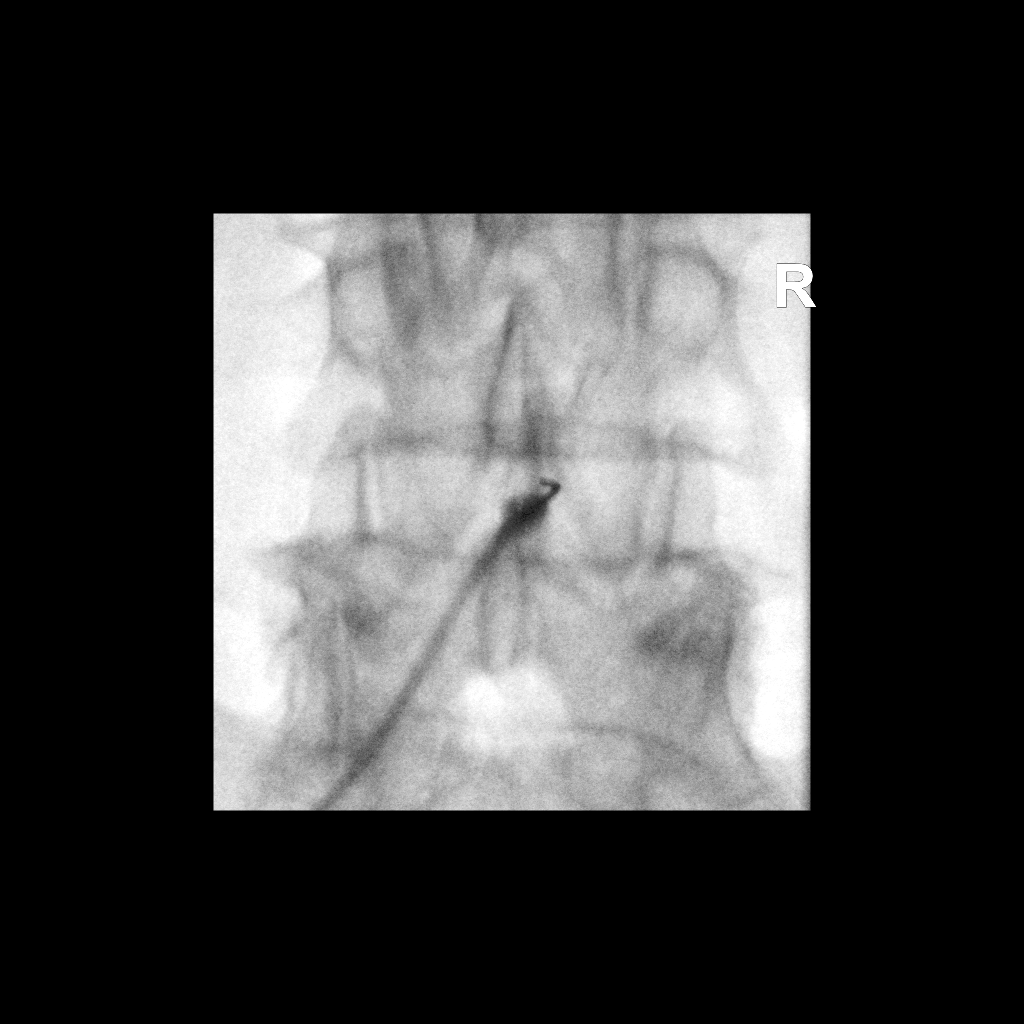

[2 of 2 positions shown; findings below may reference images not displayed]

FLUOROSCOPY TIME:  22 seconds.  4.7 mGy.

PROCEDURE:
The procedure, risks, benefits, and alternatives were explained to
the patient. Questions regarding the procedure were encouraged and
answered. The patient understands and consents to the procedure.

LUMBAR EPIDURAL INJECTION:

An interlaminar approach was performed on right at L4-5. The
overlying skin was cleansed and anesthetized. A 20 gauge epidural
needle was advanced using loss-of-resistance technique.

DIAGNOSTIC EPIDURAL INJECTION:

Injection of Isovue-M 200 shows a good epidural pattern with spread
above and below the level of needle placement, primarily on the
right no vascular opacification is seen.

THERAPEUTIC EPIDURAL INJECTION:

One hundred twenty mg of Depo-Medrol mixed with 4 cc 1% lidocaine
were instilled. The procedure was well-tolerated, and the patient
was discharged thirty minutes following the injection in good
condition.

COMPLICATIONS:
None
IMPRESSION: Technically successful epidural injection on the right L4-5.

## 2019-07-15 ENCOUNTER — Other Ambulatory Visit: Payer: Self-pay

## 2019-07-15 ENCOUNTER — Encounter: Payer: Self-pay | Admitting: Orthopaedic Surgery

## 2019-07-15 ENCOUNTER — Ambulatory Visit (INDEPENDENT_AMBULATORY_CARE_PROVIDER_SITE_OTHER): Payer: Medicare Other | Admitting: Orthopaedic Surgery

## 2019-07-15 VITALS — BP 126/79 | HR 87 | Ht 75.0 in | Wt 252.0 lb

## 2019-07-15 DIAGNOSIS — M545 Low back pain: Secondary | ICD-10-CM

## 2019-07-15 DIAGNOSIS — M79604 Pain in right leg: Secondary | ICD-10-CM

## 2019-07-15 NOTE — Progress Notes (Signed)
Patient Norman Blake:[id number] TORSTEN HEFTER, male DOB:12/22/1940, 79 y.o. [id number]  Chief Complaint  Patient presents with  . Back Pain    HPI  Norman Blake is a 79 y.o. male who has chronic lower back pain.  He has good and bad days. He played golf yesterday and did well. The warmer weather agrees with him.  He is taking his medicine. He has no new trauma, no weakness.   Body mass index is 31.5 kg/m.  ROS  Review of Systems  Constitutional: Positive for activity change.  Musculoskeletal: Positive for arthralgias, back pain and gait problem.  All other systems reviewed and are negative.   All other systems reviewed and are negative.  The following is a summary of the past history medically, past history surgically, known current medicines, social history and family history.  This information is gathered electronically by the computer from prior information and documentation.  I review this each visit and have found including this information at this point in the chart is beneficial and informative.    Past Medical History:  Diagnosis Date  . Depression   . History of hiatal hernia   . 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 CHOLECYSTECTOMY    . COLONOSCOPY N/A 06/09/2014   Procedure: COLONOSCOPY;  Surgeon: Aviva Signs Md, MD;  [facility];  Service: Gastroenterology;  Laterality: N/A;    Family History  Problem Relation Age of Onset  . Breast cancer Mother   . Cancer Father     Social History Social History   Tobacco Use  . Smoking status: Former Smoker    Packs/day: 0.50    Years: 3.00    Pack years: 1.50    Quit date: 06/09/1958    Years since quitting: 61.1  . Smokeless tobacco: Never Used  Substance Use Topics  . Alcohol use: No  . Drug use: No    Allergies  Allergen Reactions  . Atorvastatin Calcium Hives and Other (See Comments)  .  Gabapentin Other (See Comments)    Current Outpatient Medications  Medication Sig Dispense Refill  . DULoxetine (CYMBALTA) 60 MG capsule Take 60 mg by mouth daily.    . finasteride (PROSCAR) 5 MG tablet Take 5 mg by mouth at bedtime.    Marland Kitchen latanoprost (XALATAN) 0.005 % ophthalmic solution Place 1 drop into both eyes at bedtime.    . modafinil (PROVIGIL) 100 MG tablet Take 100 mg by mouth daily.    . pregabalin (LYRICA) 50 MG capsule Take 50 mg by mouth 3 (three) times daily.     . simvastatin (ZOCOR) 80 MG tablet Take 40 mg by mouth at bedtime.    . tamsulosin (FLOMAX) 0.4 MG CAPS capsule Take 0.4 mg by mouth daily.    . timolol (TIMOPTIC-XR) 0.5 % ophthalmic gel-forming Place 1 drop into both eyes daily.    . traMADol (ULTRAM) 50 MG tablet Take 50 mg by mouth 4 (four) times daily.    . traZODone (DESYREL) 150 MG tablet Take 150 mg by mouth at bedtime.    . Multiple Vitamin (MULTIVITAMIN) tablet Take 1 tablet by mouth daily.    . predniSONE (STERAPRED UNI-PAK 21 TAB) 5 MG (21) TBPK tablet Take 6 pills first day; 5 pills second day; 4 pills third day; 3 pills fourth day; 2 pills next day and 1 pill last day. (Patient not taking: Reported on  07/15/2019) 21 tablet 0  . Rivaroxaban 15 & 20 MG TBPK Take as directed on package: Start with one 15mg  tablet by mouth twice a day with food. On Day 22, switch to one 20mg  tablet once a day with food. (Patient not taking: Reported on 09/12/2018) 51 each 0   No current facility-administered medications for this visit.     Physical Exam  Blood pressure 126/79, pulse 87, height 6\' 3"  (1.905 m), weight 252 lb (114.3 kg).  Constitutional: overall normal hygiene, normal nutrition, well developed, normal grooming, normal body habitus. Assistive device:none  Musculoskeletal: gait and station Limp none, muscle tone and strength are normal, no tremors or atrophy is present.  .  Neurological: coordination overall normal.  Deep tendon reflex/nerve stretch intact.   Sensation normal.  Cranial nerves II-XII intact.   Skin:   Normal overall no scars, lesions, ulcers or rashes. No psoriasis.  Psychiatric: Alert and oriented x 3.  Recent memory intact, remote memory unclear.  Normal mood and affect. Well groomed.  Good eye contact.  Cardiovascular: overall no swelling, no varicosities, no edema bilaterally, normal temperatures of the legs and arms, no clubbing, cyanosis and good capillary refill.  Lymphatic: palpation is normal.  Spine/Pelvis examination:  Inspection:  Overall, sacoiliac joint benign and hips nontender; without crepitus or defects.   Thoracic spine inspection: Alignment normal without kyphosis present   Lumbar spine inspection:  Alignment  with normal lumbar lordosis, without scoliosis apparent.   Thoracic spine palpation:  without tenderness of spinal processes   Lumbar spine palpation: without tenderness of lumbar area; without tightness of lumbar muscles    Range of Motion:   Lumbar flexion, forward flexion is normal without pain or tenderness    Lumbar extension is full without pain or tenderness   Left lateral bend is normal without pain or tenderness   Right lateral bend is normal without pain or tenderness   Straight leg raising is normal  Strength & tone: normal   Stability overall normal stability  All other systems reviewed and are negative   The patient has been educated about the nature of the problem(s) and counseled on treatment options.  The patient appeared to understand what I have discussed and is in agreement with it.  Encounter Diagnosis  Name Primary?  . Low back pain radiating to right lower extremity Yes    PLAN Call if any problems.  Precautions discussed.  Continue current medications.   Return to clinic 3 months   Electronically Signed Sanjuana Kava, MD 4/27/20219:20 AM

## 2019-07-22 DIAGNOSIS — S332XXA Dislocation of sacroiliac and sacrococcygeal joint, initial encounter: Secondary | ICD-10-CM | POA: Diagnosis not present

## 2019-07-22 DIAGNOSIS — M9904 Segmental and somatic dysfunction of sacral region: Secondary | ICD-10-CM | POA: Diagnosis not present

## 2019-07-25 DIAGNOSIS — G9009 Other idiopathic peripheral autonomic neuropathy: Secondary | ICD-10-CM | POA: Diagnosis not present

## 2019-07-25 DIAGNOSIS — H4010X0 Unspecified open-angle glaucoma, stage unspecified: Secondary | ICD-10-CM | POA: Diagnosis not present

## 2019-07-25 DIAGNOSIS — R202 Paresthesia of skin: Secondary | ICD-10-CM | POA: Diagnosis not present

## 2019-07-25 DIAGNOSIS — E6609 Other obesity due to excess calories: Secondary | ICD-10-CM | POA: Diagnosis not present

## 2019-07-25 DIAGNOSIS — M79661 Pain in right lower leg: Secondary | ICD-10-CM | POA: Diagnosis not present

## 2019-07-25 DIAGNOSIS — E782 Mixed hyperlipidemia: Secondary | ICD-10-CM | POA: Diagnosis not present

## 2019-07-25 DIAGNOSIS — G4700 Insomnia, unspecified: Secondary | ICD-10-CM | POA: Diagnosis not present

## 2019-07-25 DIAGNOSIS — Z0189 Encounter for other specified special examinations: Secondary | ICD-10-CM | POA: Diagnosis not present

## 2019-07-25 DIAGNOSIS — F4310 Post-traumatic stress disorder, unspecified: Secondary | ICD-10-CM | POA: Diagnosis not present

## 2019-07-25 DIAGNOSIS — N400 Enlarged prostate without lower urinary tract symptoms: Secondary | ICD-10-CM | POA: Diagnosis not present

## 2019-07-29 DIAGNOSIS — E6609 Other obesity due to excess calories: Secondary | ICD-10-CM | POA: Diagnosis not present

## 2019-07-29 DIAGNOSIS — N400 Enlarged prostate without lower urinary tract symptoms: Secondary | ICD-10-CM | POA: Diagnosis not present

## 2019-07-29 DIAGNOSIS — F4310 Post-traumatic stress disorder, unspecified: Secondary | ICD-10-CM | POA: Diagnosis not present

## 2019-07-29 DIAGNOSIS — G47429 Narcolepsy in conditions classified elsewhere without cataplexy: Secondary | ICD-10-CM | POA: Diagnosis not present

## 2019-07-29 DIAGNOSIS — R202 Paresthesia of skin: Secondary | ICD-10-CM | POA: Diagnosis not present

## 2019-07-29 DIAGNOSIS — E782 Mixed hyperlipidemia: Secondary | ICD-10-CM | POA: Diagnosis not present

## 2019-07-29 DIAGNOSIS — H4010X0 Unspecified open-angle glaucoma, stage unspecified: Secondary | ICD-10-CM | POA: Diagnosis not present

## 2019-07-29 DIAGNOSIS — G4700 Insomnia, unspecified: Secondary | ICD-10-CM | POA: Diagnosis not present

## 2019-07-29 DIAGNOSIS — G9009 Other idiopathic peripheral autonomic neuropathy: Secondary | ICD-10-CM | POA: Diagnosis not present

## 2019-07-29 DIAGNOSIS — Z0001 Encounter for general adult medical examination with abnormal findings: Secondary | ICD-10-CM | POA: Diagnosis not present

## 2019-08-07 DIAGNOSIS — M9904 Segmental and somatic dysfunction of sacral region: Secondary | ICD-10-CM | POA: Diagnosis not present

## 2019-08-07 DIAGNOSIS — S332XXA Dislocation of sacroiliac and sacrococcygeal joint, initial encounter: Secondary | ICD-10-CM | POA: Diagnosis not present

## 2019-08-25 DIAGNOSIS — R972 Elevated prostate specific antigen [PSA]: Secondary | ICD-10-CM | POA: Diagnosis not present

## 2019-09-01 DIAGNOSIS — N401 Enlarged prostate with lower urinary tract symptoms: Secondary | ICD-10-CM | POA: Diagnosis not present

## 2019-09-01 DIAGNOSIS — R3912 Poor urinary stream: Secondary | ICD-10-CM | POA: Diagnosis not present

## 2019-09-01 DIAGNOSIS — R972 Elevated prostate specific antigen [PSA]: Secondary | ICD-10-CM | POA: Diagnosis not present

## 2019-09-03 IMAGING — US US EXTREM LOW VENOUS*R*
1 series · 13 of 24 positions shown · non-contrast
Comparison: None.

CLINICAL DATA: Right leg pain and swelling.



[Series 1: us extrem low venous*right* · 0.12mm/px · 13 of 62 slices shown]
[im 1/62]
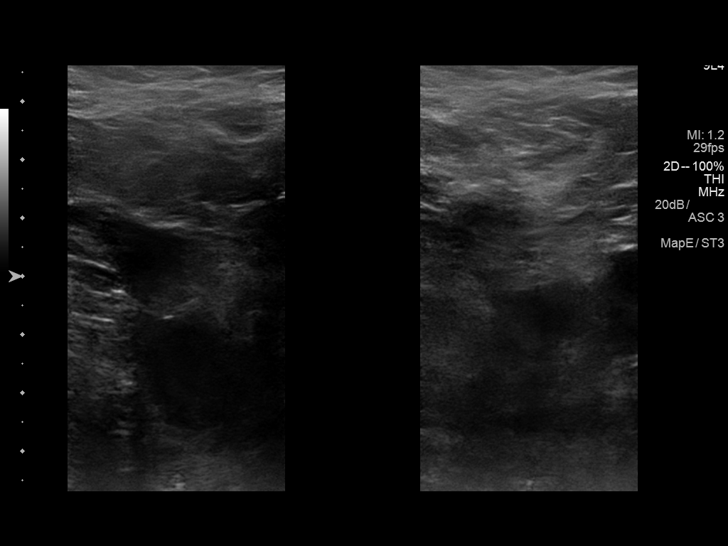
[im 6/62]
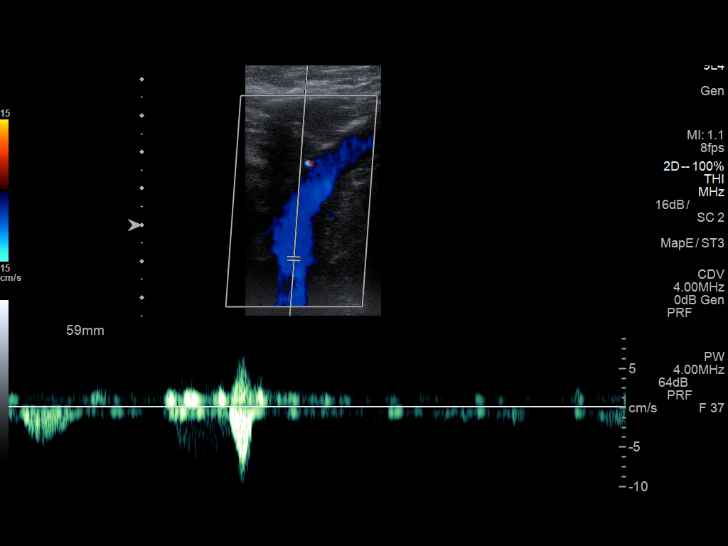
[im 11/62]
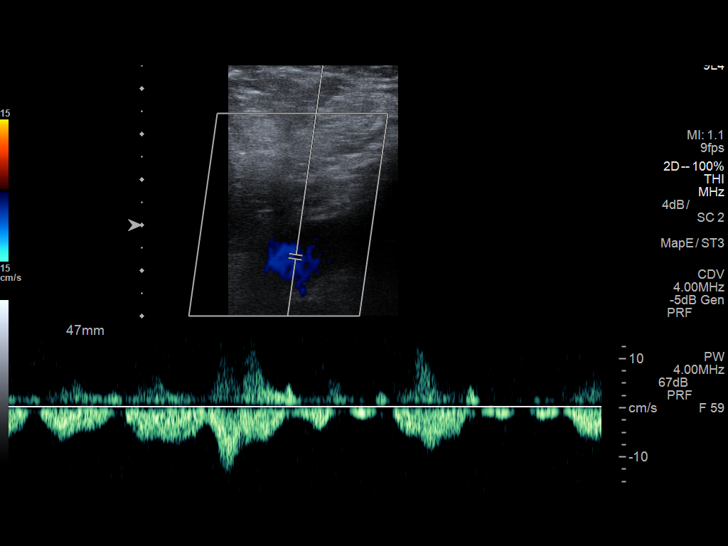
[im 16/62]
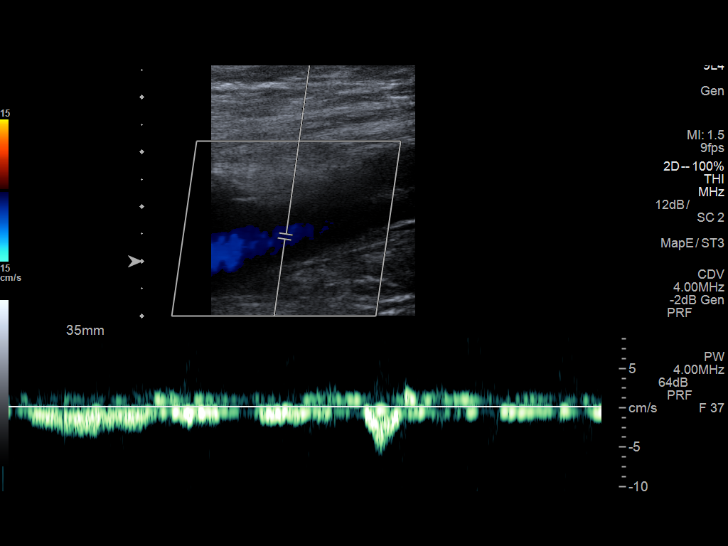
[im 22/62]
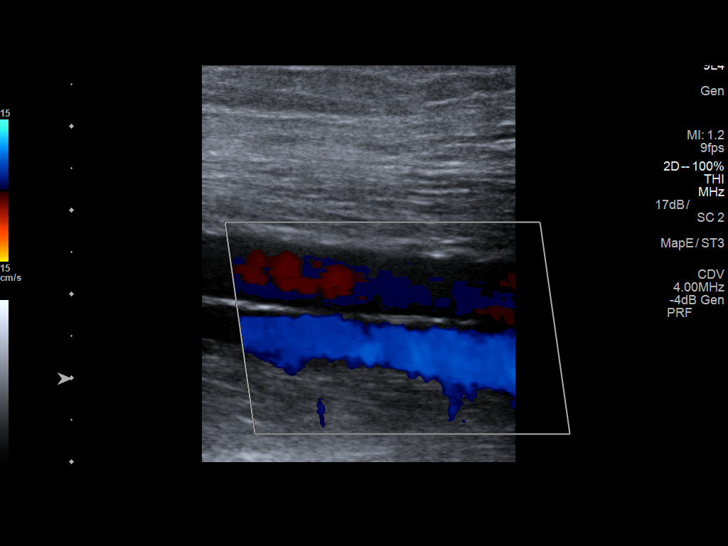
[im 27/62]
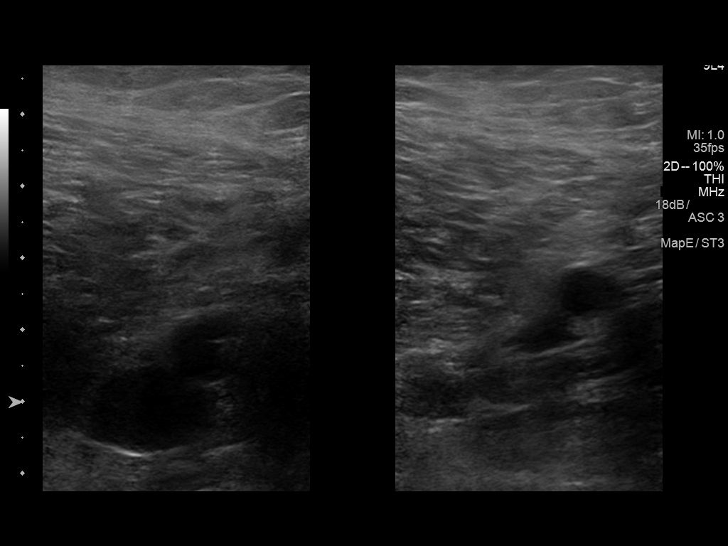
[im 32/62]
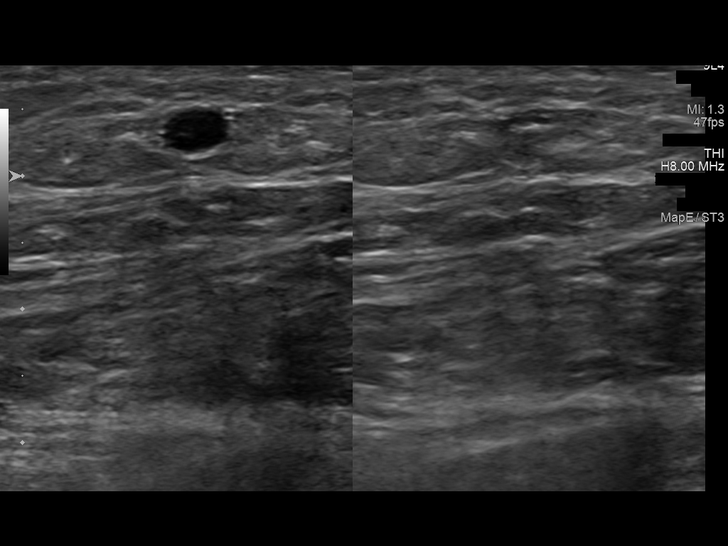
[im 35/62]
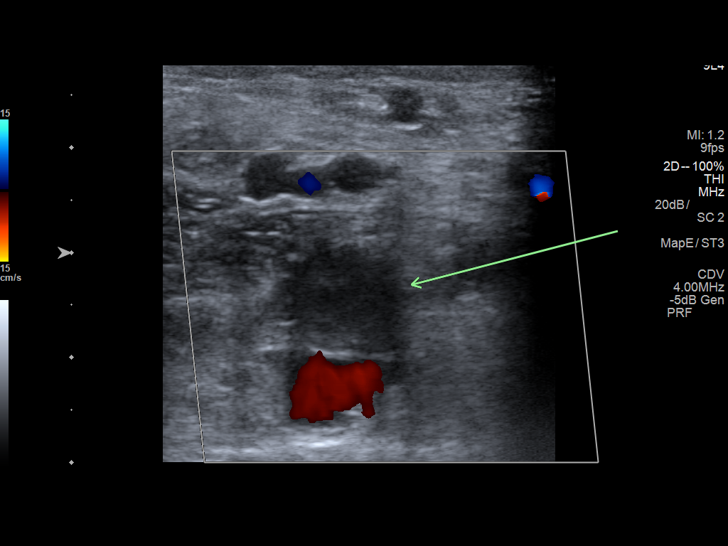
[im 40/62]
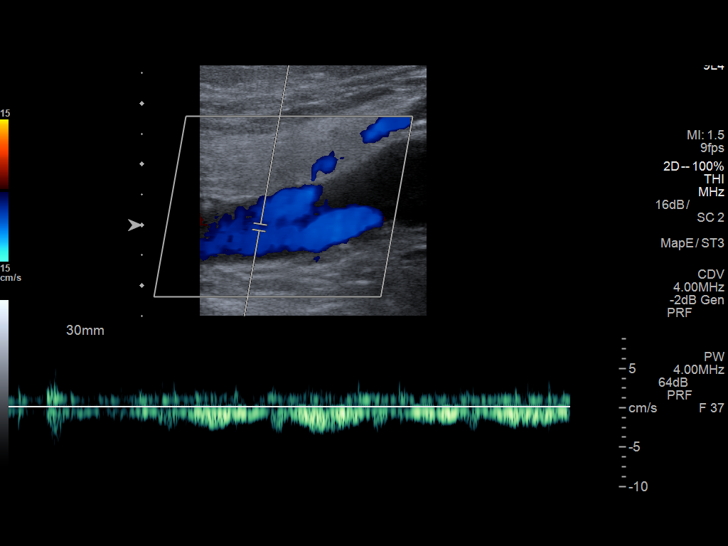
[im 46/62]
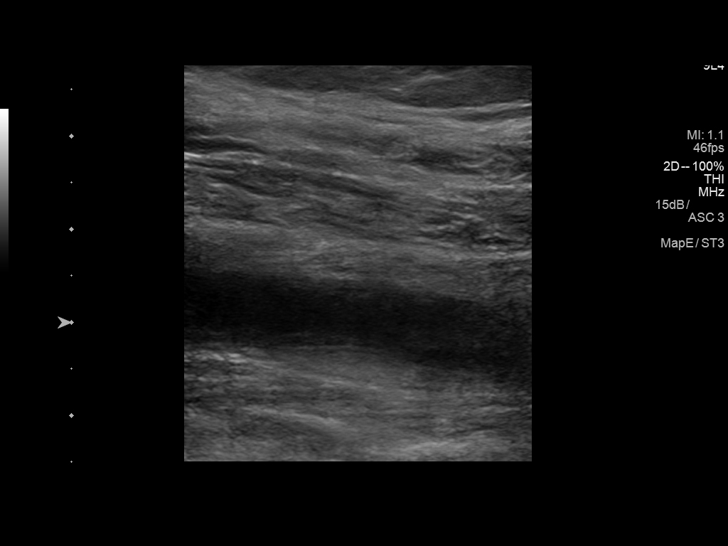
[im 51/62]
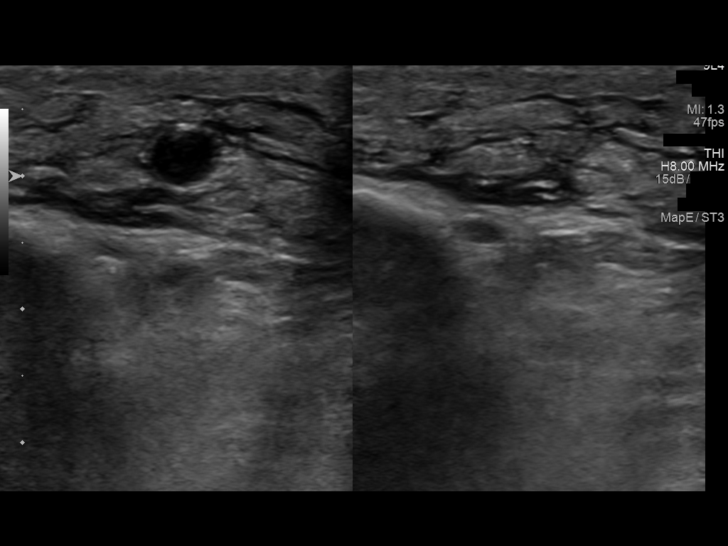
[im 56/62]
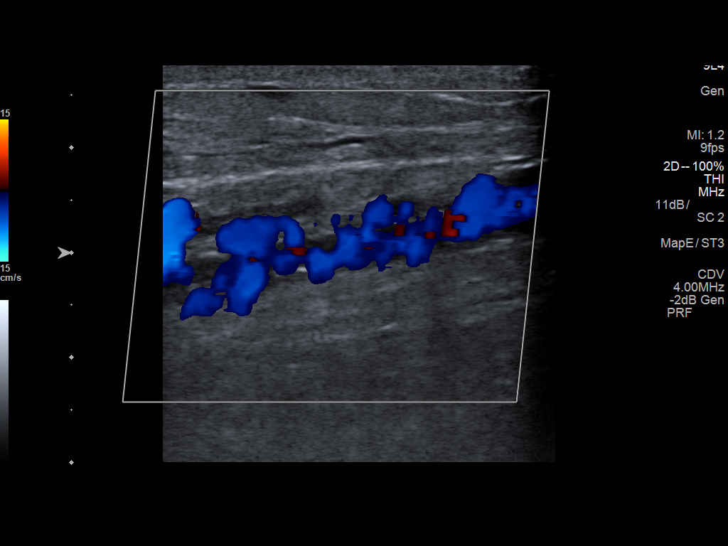
[im 62/62]
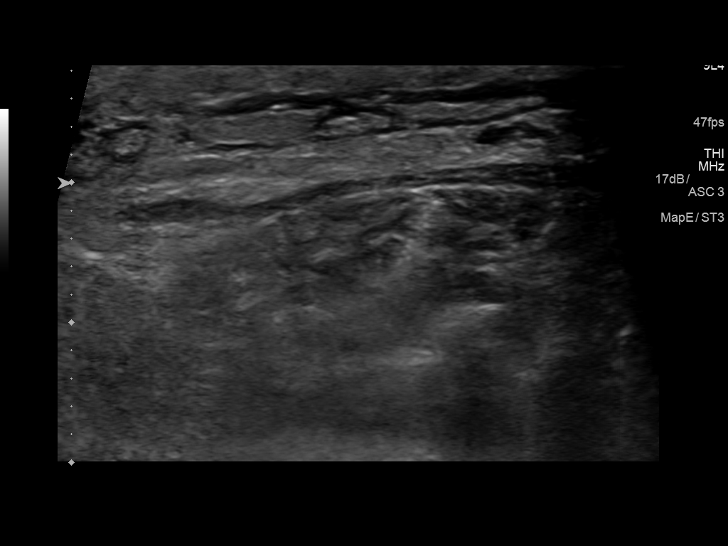

[13 of 24 positions shown; findings below may reference images not displayed]

FINDINGS: Contralateral Common Femoral Vein: Respiratory phasicity is normal
and symmetric with the symptomatic side. No evidence of thrombus.
Normal compressibility.

Common Femoral Vein: No evidence of thrombus. Normal
compressibility, respiratory phasicity and response to augmentation.

Saphenofemoral Junction: No evidence of thrombus. Normal
compressibility and flow on color Doppler imaging.

Profunda Femoral Vein: No evidence of thrombus. Normal
compressibility and flow on color Doppler imaging.

Femoral Vein: No evidence of thrombus. Normal compressibility,
respiratory phasicity and response to augmentation.

Popliteal Vein: Occluded.  Noncompressible.

Calf Veins: No evidence of thrombus. Normal compressibility and flow
on color Doppler imaging.

Superficial Great Saphenous Vein: No evidence of thrombus. Normal
compressibility.

Venous Reflux:  None.

Other Findings:  None.
IMPRESSION: 1. Acute deep venous thrombosis of the right popliteal vein.

These results were called by telephone at the time of interpretation
on 09/03/2017 at [DATE] to Dr. JULLON LIENAD , who verbally
acknowledged these results.

## 2019-10-04 IMAGING — US US EXTREM LOW VENOUS*R*
1 series · 13 of 24 positions shown · non-contrast
Comparison: Right lower extremity venous Doppler ultrasound -
09/03/2017

CLINICAL DATA: History of DVT with right lower extremity edema.
Patient is currently on anticoagulation. Evaluate for acute or
chronic DVT.



[Series 1: us extrem low venous*right* · 0.08mm/px · 13 of 40 slices shown]
[im 1/40]
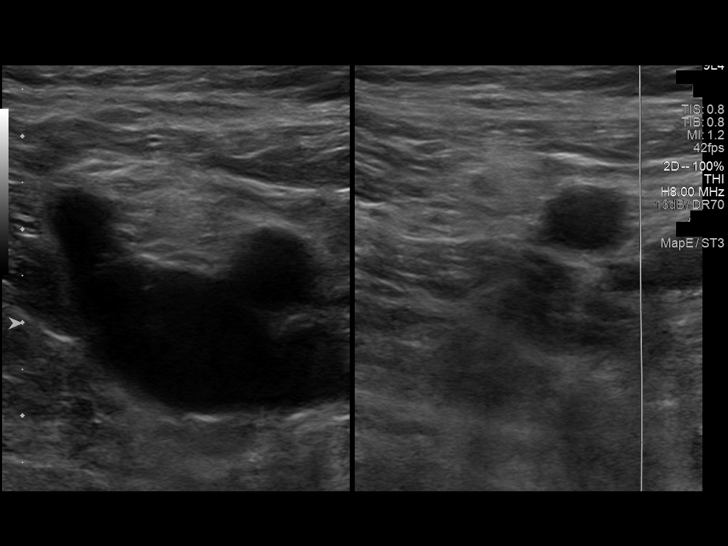
[im 4/40]
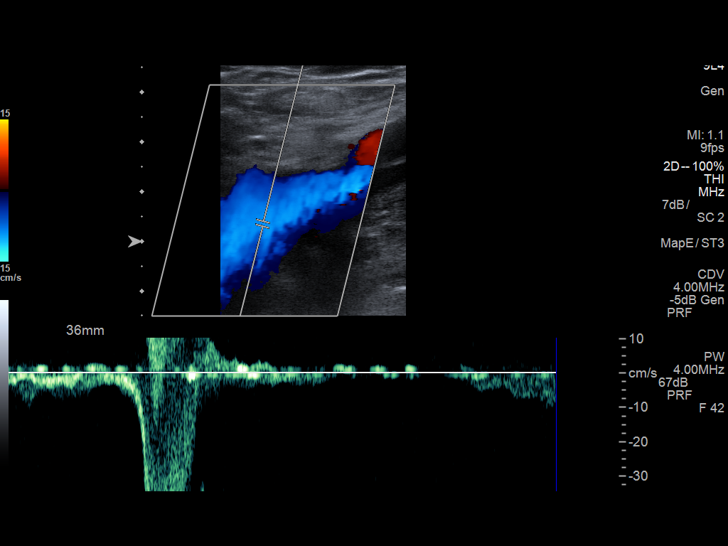
[im 7/40]
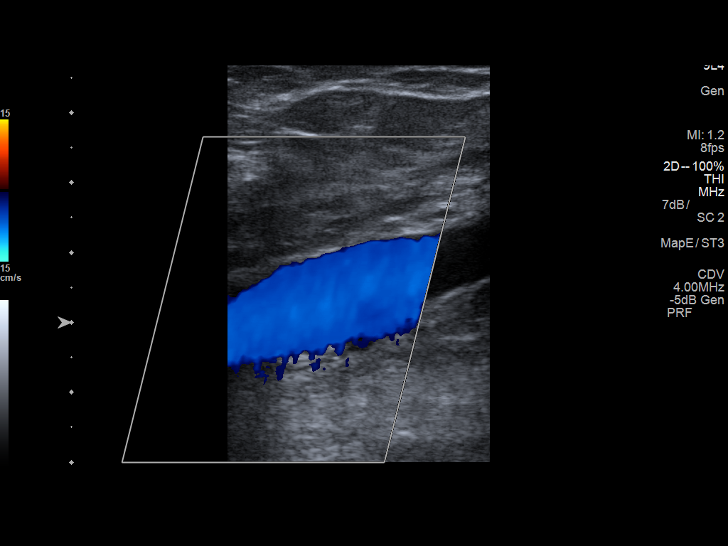
[im 11/40]
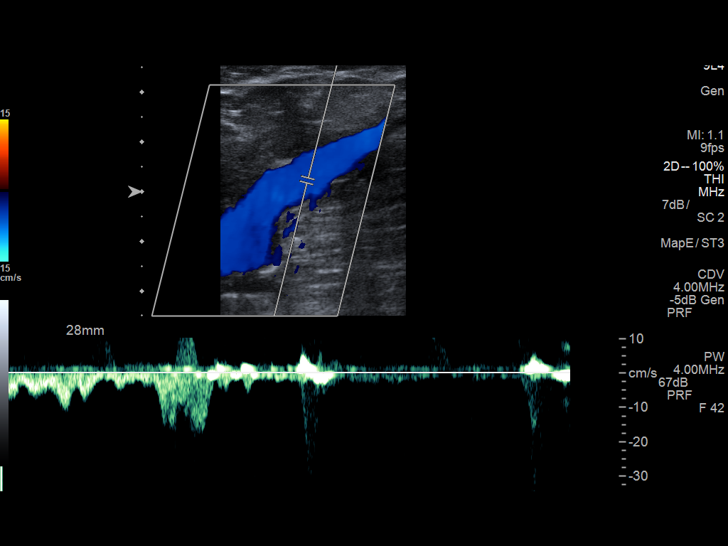
[im 14/40]
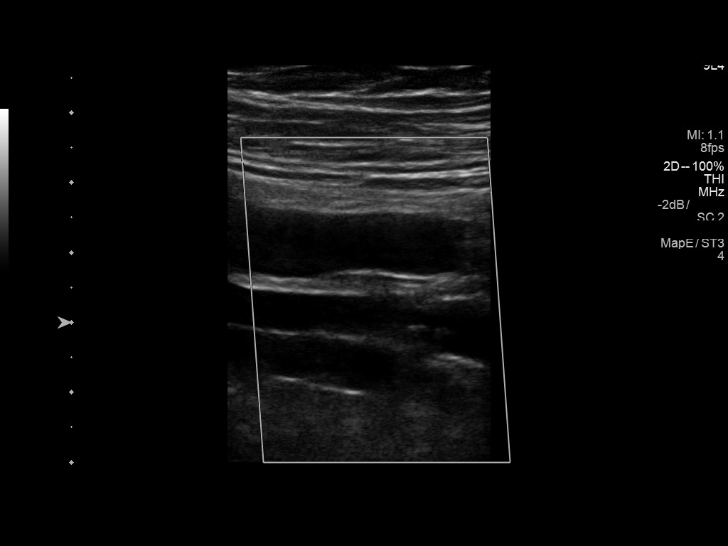
[im 17/40]
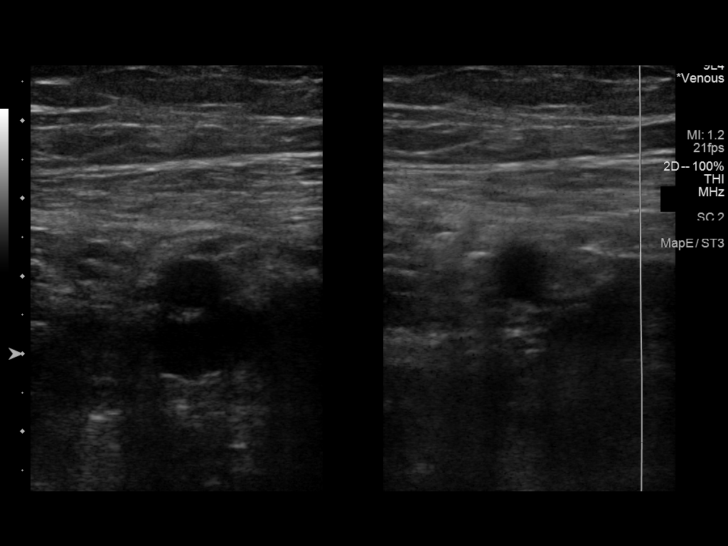
[im 21/40]
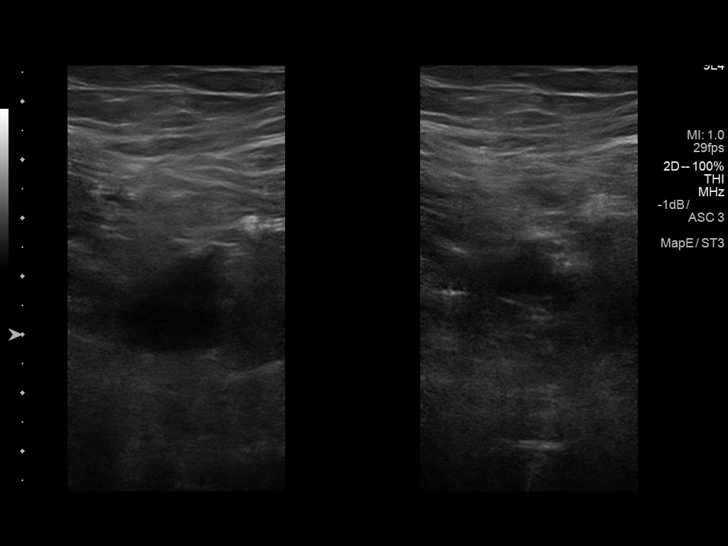
[im 23/40]
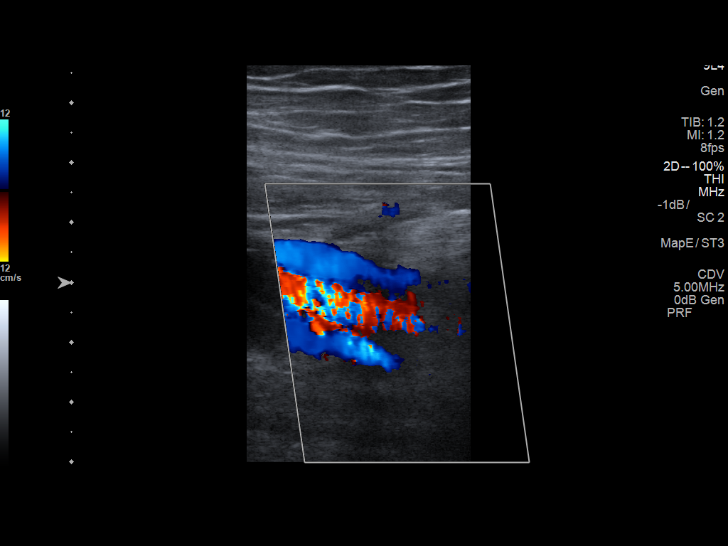
[im 26/40]
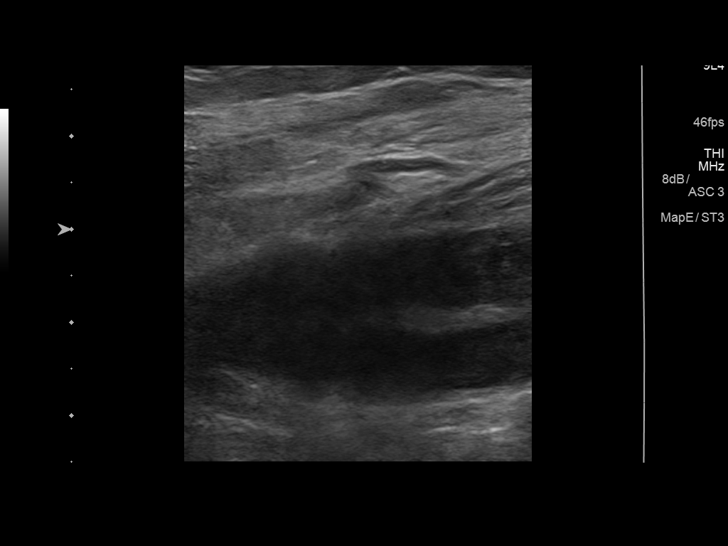
[im 29/40]
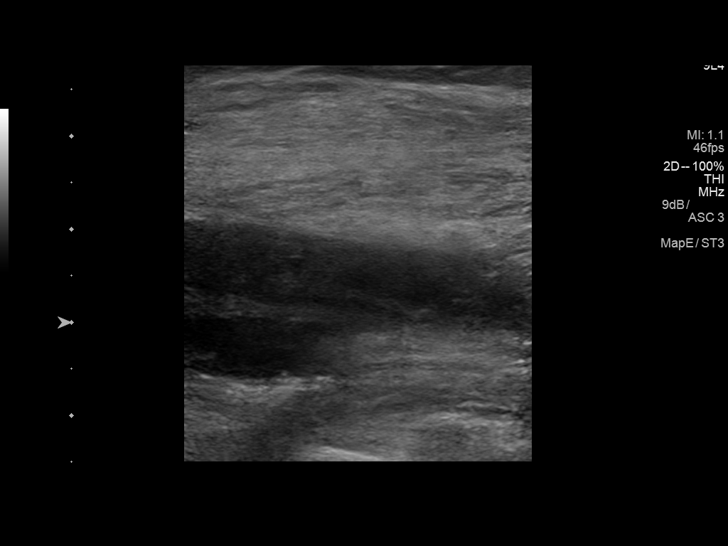
[im 33/40]
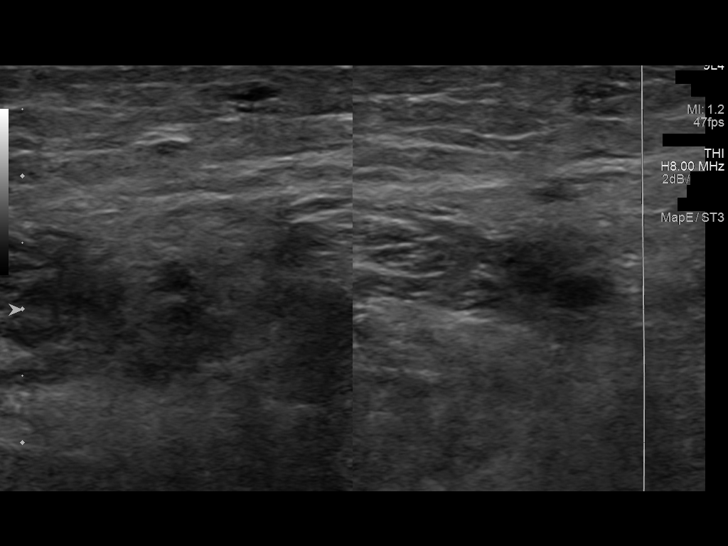
[im 36/40]
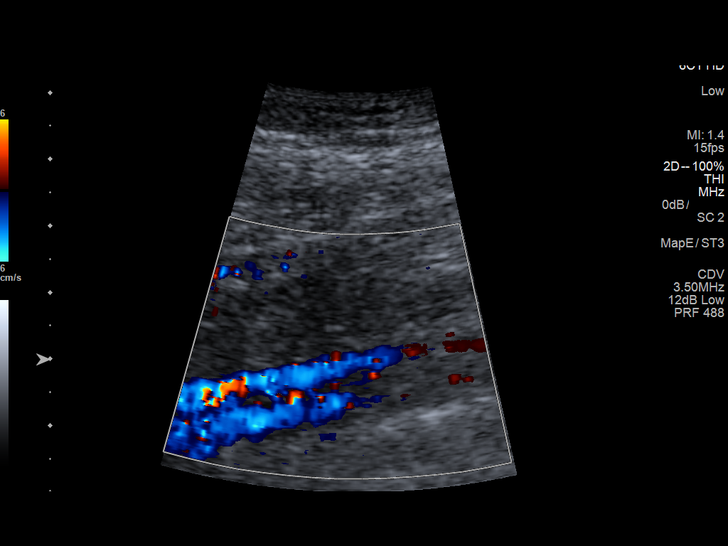
[im 40/40]
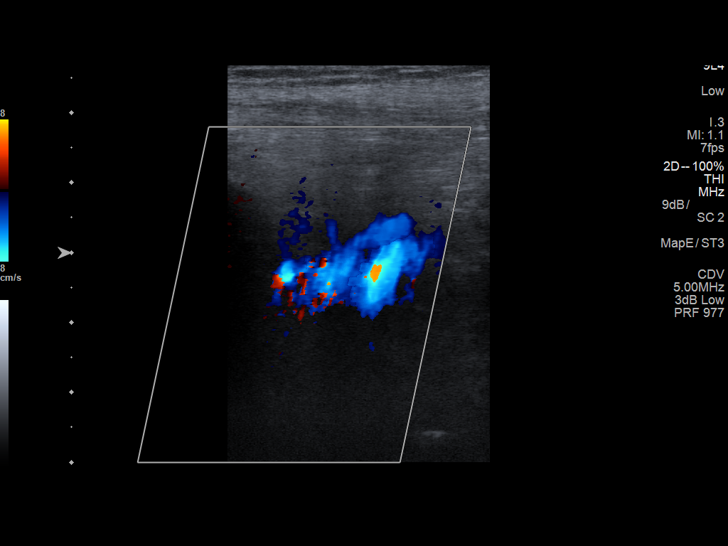

[13 of 24 positions shown; findings below may reference images not displayed]

FINDINGS: Contralateral Common Femoral Vein: Respiratory phasicity is normal
and symmetric with the symptomatic side. No evidence of thrombus.
Normal compressibility.

Common Femoral Vein: No evidence of thrombus. Normal
compressibility, respiratory phasicity and response to augmentation.

Saphenofemoral Junction: No evidence of thrombus. Normal
compressibility and flow on color Doppler imaging.

Profunda Femoral Vein: No evidence of thrombus. Normal
compressibility and flow on color Doppler imaging.

Femoral Vein: No evidence of thrombus. Normal compressibility,
respiratory phasicity and response to augmentation.

Popliteal Vein: Grossly unchanged hypoechoic expansile occlusive DVT
within the right popliteal vein (images 26 through 31), unchanged
compared to the [DATE] examination

Calf Veins: No evidence of thrombus. Normal compressibility and flow
on color Doppler imaging.

Superficial Great Saphenous Vein: No evidence of thrombus. Normal
compressibility.

Venous Reflux:  None.

Other Findings:  None.
IMPRESSION: Occlusive DVT within the right popliteal vein, unchanged compared to
the 09/03/2017 examination.

## 2019-10-06 DIAGNOSIS — M9904 Segmental and somatic dysfunction of sacral region: Secondary | ICD-10-CM | POA: Diagnosis not present

## 2019-10-06 DIAGNOSIS — S332XXA Dislocation of sacroiliac and sacrococcygeal joint, initial encounter: Secondary | ICD-10-CM | POA: Diagnosis not present

## 2019-10-09 IMAGING — US RIGHT LOWER EXTREMITY VENOUS ULTRASOUND
1 series · 13 of 24 positions shown · non-contrast
Comparison: None.

CLINICAL DATA: History of right popliteal DVT, lower extremity
edema



[Series 1: right lower extremity venous ultrasound · 0.10mm/px · 13 of 49 slices shown]
[im 1/49]
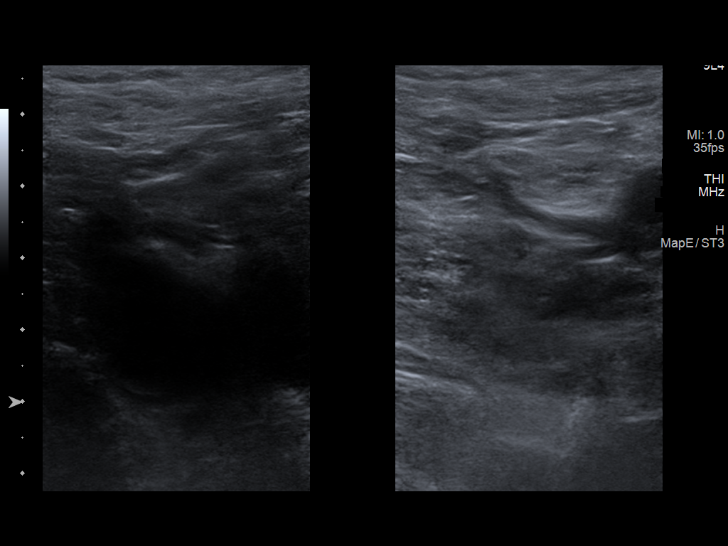
[im 5/49]
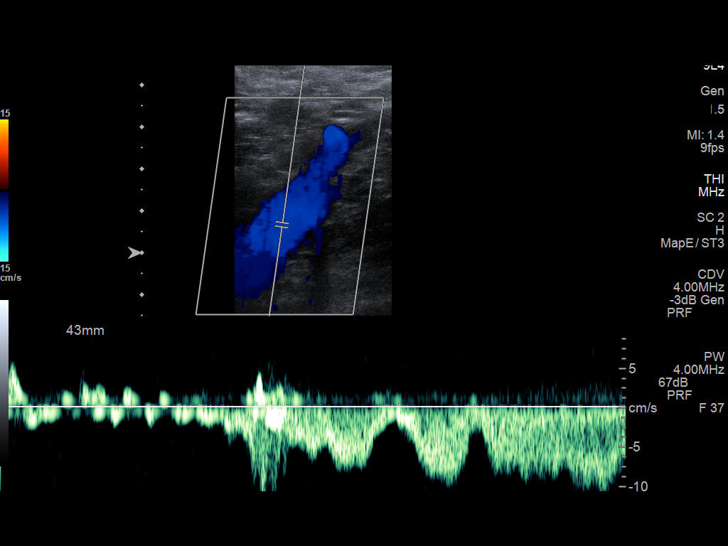
[im 9/49]
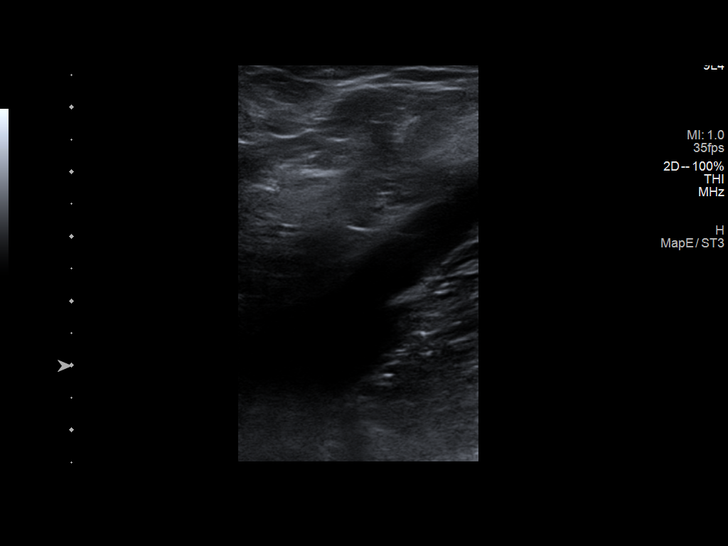
[im 13/49]
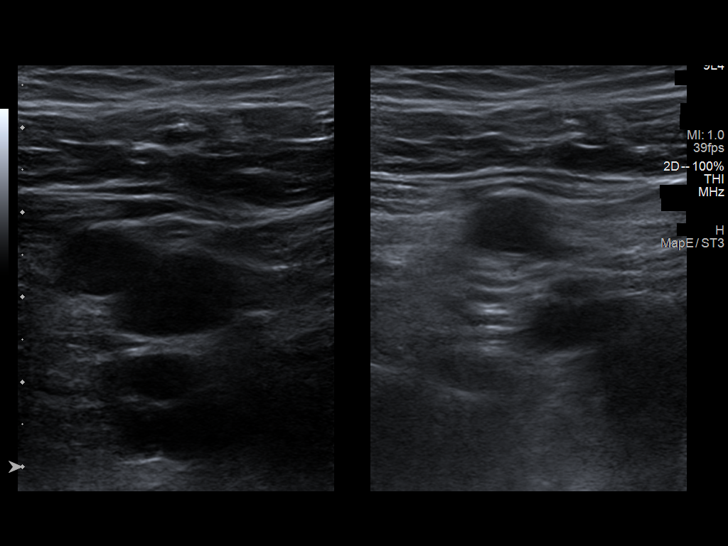
[im 17/49]
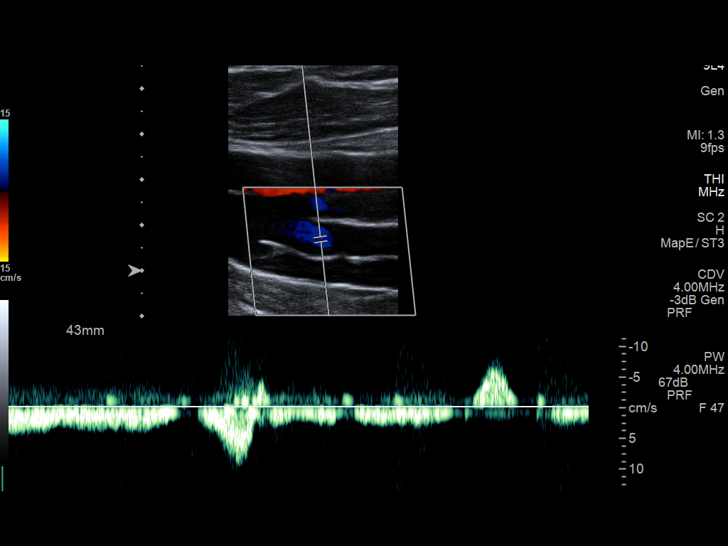
[im 21/49]
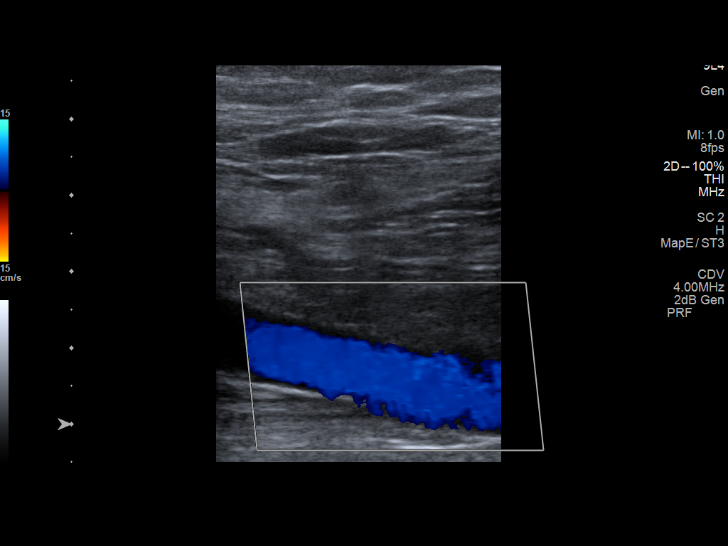
[im 26/49]
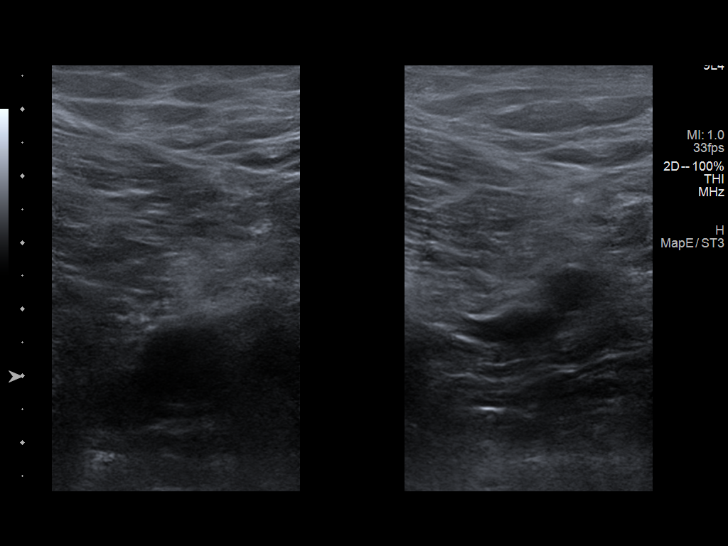
[im 28/49]
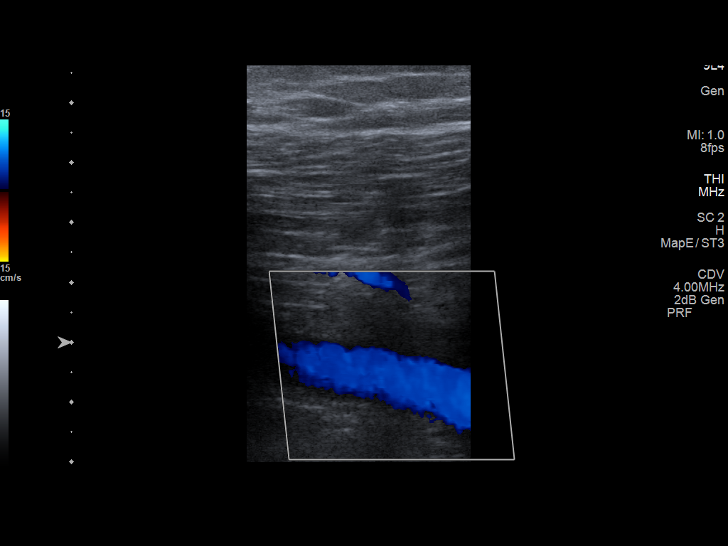
[im 32/49]
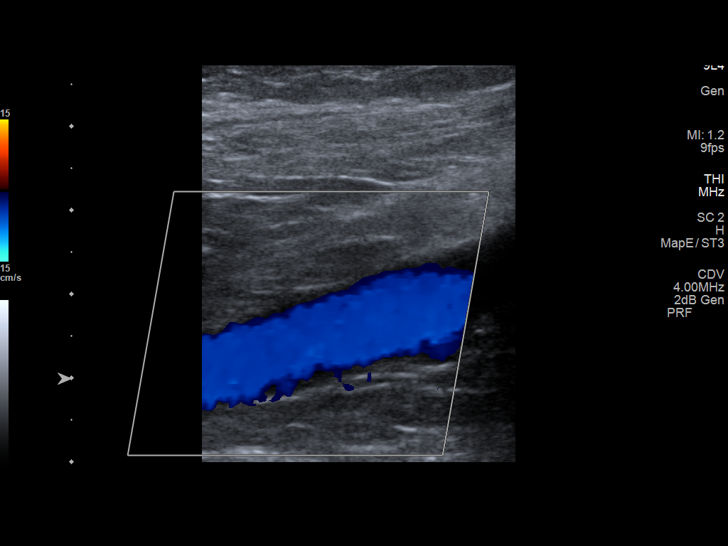
[im 36/49]
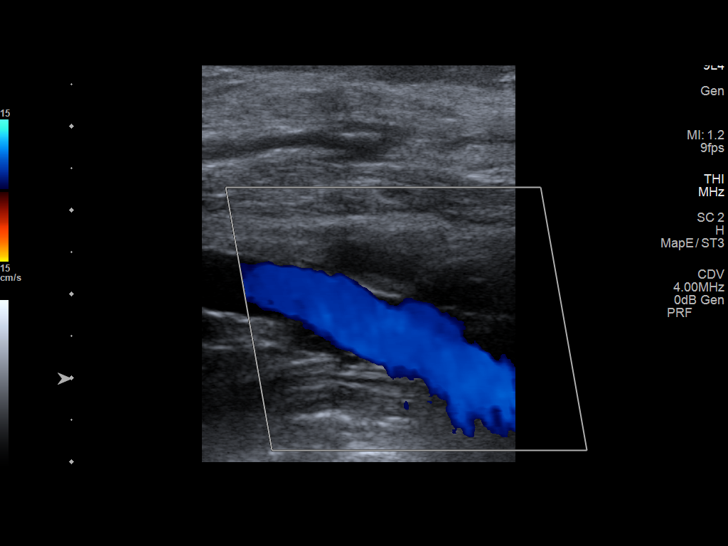
[im 40/49]
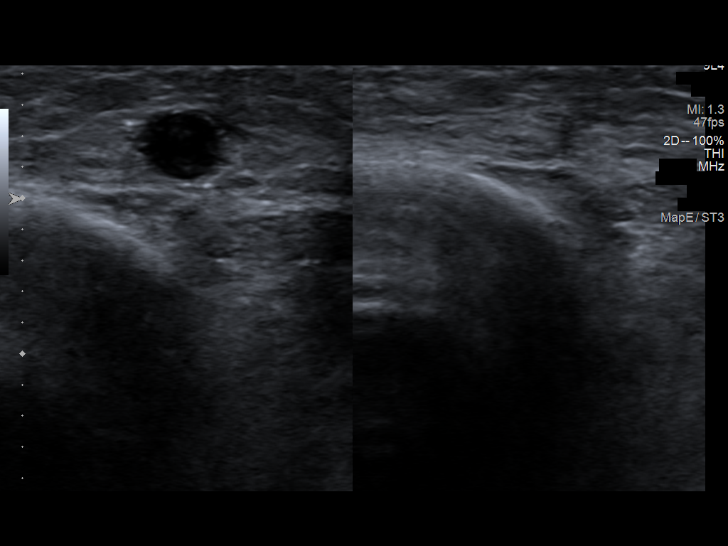
[im 44/49]
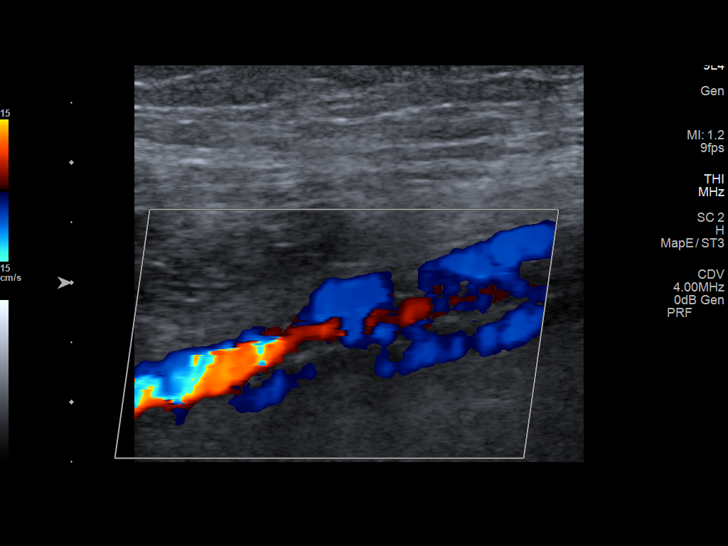
[im 49/49]
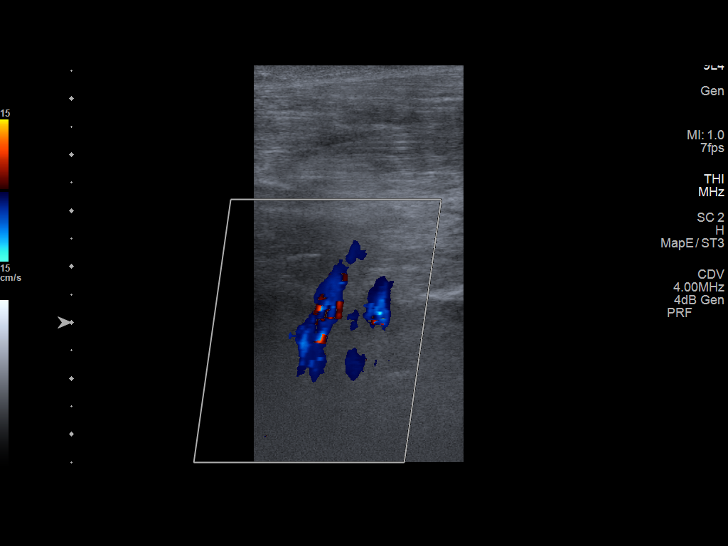

[13 of 24 positions shown; findings below may reference images not displayed]

FINDINGS: Contralateral Common Femoral Vein: Respiratory phasicity is normal
and symmetric with the symptomatic side. No evidence of thrombus.
Normal compressibility.

Common Femoral Vein: No evidence of thrombus. Normal
compressibility, respiratory phasicity and response to augmentation.

Saphenofemoral Junction: No evidence of thrombus. Normal
compressibility and flow on color Doppler imaging.

Profunda Femoral Vein: No evidence of thrombus. Normal
compressibility and flow on color Doppler imaging.

Femoral Vein: No evidence of thrombus. Normal compressibility,
respiratory phasicity and response to augmentation.

Popliteal Vein: No evidence of thrombus. Normal compressibility,
respiratory phasicity and response to augmentation.

Calf Veins: No evidence of thrombus. Normal compressibility and flow
on color Doppler imaging.

Superficial Great Saphenous Vein: No evidence of thrombus. Normal
compressibility.

Venous Reflux:  Not assessed

Other Findings:  None.
IMPRESSION: No evidence of deep venous thrombosis.

## 2019-10-14 ENCOUNTER — Ambulatory Visit (INDEPENDENT_AMBULATORY_CARE_PROVIDER_SITE_OTHER): Payer: Medicare Other | Admitting: Orthopaedic Surgery

## 2019-10-14 ENCOUNTER — Other Ambulatory Visit: Payer: Self-pay

## 2019-10-14 ENCOUNTER — Encounter: Payer: Self-pay | Admitting: Orthopaedic Surgery

## 2019-10-14 VITALS — BP 147/73 | HR 59 | Ht 75.0 in | Wt 255.0 lb

## 2019-10-14 DIAGNOSIS — M545 Low back pain, unspecified: Secondary | ICD-10-CM

## 2019-10-14 NOTE — Progress Notes (Signed)
Patient Norman Blake, male DOB:February 16, 1941, 79 y.o. [id number]  Chief Complaint  Patient presents with  . Back Pain    HPI  Norman Blake is a 79 y.o. male who has chronic lower back pain.  He has more pain with increased activity. He is going to [facility] later this week for the [place].  He has no trauma, no weakness.  He is taking his medicine.   Body mass index is 31.87 kg/m.  ROS  Review of Systems  Constitutional: Positive for activity change.  Musculoskeletal: Positive for arthralgias, back pain and gait problem.  All other systems reviewed and are negative.   All other systems reviewed and are negative.  The following is a summary of the past history medically, past history surgically, known current medicines, social history and family history.  This information is gathered electronically by the computer from prior information and documentation.  I review this each visit and have found including this information at this point in the chart is beneficial and informative.    Past Medical History:  Diagnosis Date  . Depression   . History of hiatal hernia   . 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 CHOLECYSTECTOMY    . COLONOSCOPY N/A 06/09/2014   Procedure: COLONOSCOPY;  Surgeon: Aviva Signs Md, MD;  [facility];  Service: Gastroenterology;  Laterality: N/A;    Family History  Problem Relation Age of Onset  . Breast cancer Mother   . Cancer Father     Social History Social History   Tobacco Use  . Smoking status: Former Smoker    Packs/day: 0.50    Years: 3.00    Pack years: 1.50    Quit date: 06/09/1958    Years since quitting: 61.3  . Smokeless tobacco: Never Used  Substance Use Topics  . Alcohol use: No  . Drug use: No    Allergies  Allergen Reactions  . Atorvastatin Calcium Hives and Other (See Comments)  . Gabapentin Other  (See Comments)    Current Outpatient Medications  Medication Sig Dispense Refill  . DULoxetine (CYMBALTA) 60 MG capsule Take 60 mg by mouth daily.    . finasteride (PROSCAR) 5 MG tablet Take 5 mg by mouth at bedtime.    Marland Kitchen latanoprost (XALATAN) 0.005 % ophthalmic solution Place 1 drop into both eyes at bedtime.    . modafinil (PROVIGIL) 100 MG tablet Take 100 mg by mouth daily.    . Multiple Vitamin (MULTIVITAMIN) tablet Take 1 tablet by mouth daily.    . predniSONE (STERAPRED UNI-PAK 21 TAB) 5 MG (21) TBPK tablet Take 6 pills first day; 5 pills second day; 4 pills third day; 3 pills fourth day; 2 pills next day and 1 pill last day. 21 tablet 0  . Rivaroxaban 15 & 20 MG TBPK Take as directed on package: Start with one 15mg  tablet by mouth twice a day with food. On Day 22, switch to one 20mg  tablet once a day with food. 51 each 0  . simvastatin (ZOCOR) 80 MG tablet Take 40 mg by mouth at bedtime.    . tamsulosin (FLOMAX) 0.4 MG CAPS capsule Take 0.4 mg by mouth daily.    . traMADol (ULTRAM) 50 MG tablet Take 50 mg by mouth 4 (four) times daily.    . traZODone (DESYREL) 150 MG tablet Take 150 mg by mouth at bedtime.    Marland Kitchen  pregabalin (LYRICA) 150 MG capsule Take 150 mg by mouth 2 (two) times daily.    . timolol (TIMOPTIC) 0.5 % ophthalmic solution 1 drop 2 (two) times daily.     No current facility-administered medications for this visit.     Physical Exam  Blood pressure (!) 147/73, pulse 59, height 6\' 3"  (1.905 m), weight (!) 255 lb (115.7 kg).  Constitutional: overall normal hygiene, normal nutrition, well developed, normal grooming, normal body habitus. Assistive device:cane  Musculoskeletal: gait and station Limp right, muscle tone and strength are normal, no tremors or atrophy is present.  .  Neurological: coordination overall normal.  Deep tendon reflex/nerve stretch intact.  Sensation normal.  Cranial nerves II-XII intact.   Skin:   Normal overall no scars, lesions, ulcers or  rashes. No psoriasis.  Psychiatric: Alert and oriented x 3.  Recent memory intact, remote memory unclear.  Normal mood and affect. Well groomed.  Good eye contact.  Cardiovascular: overall no swelling, no varicosities, no edema bilaterally, normal temperatures of the legs and arms, no clubbing, cyanosis and good capillary refill.  Lymphatic: palpation is normal.  Spine/Pelvis examination:  Inspection:  Overall, sacoiliac joint benign and hips nontender; without crepitus or defects.   Thoracic spine inspection: Alignment normal without kyphosis present   Lumbar spine inspection:  Alignment  with normal lumbar lordosis, without scoliosis apparent.   Thoracic spine palpation:  without tenderness of spinal processes   Lumbar spine palpation: without tenderness of lumbar area; without tightness of lumbar muscles    Range of Motion:   Lumbar flexion, forward flexion is normal without pain or tenderness    Lumbar extension is full without pain or tenderness   Left lateral bend is normal without pain or tenderness   Right lateral bend is normal without pain or tenderness   Straight leg raising is normal  Strength & tone: normal   Stability overall normal stability  All other systems reviewed and are negative   The patient has been educated about the nature of the problem(s) and counseled on treatment options.  The patient appeared to understand what I have discussed and is in agreement with it.  Encounter Diagnosis  Name Primary?  . Low back pain radiating to right lower extremity Yes    PLAN Call if any problems.  Precautions discussed.  Continue current medications.   Return to clinic 3 months   Electronically Signed Sanjuana Kava, MD 7/27/20218:55 AM

## 2019-10-28 DIAGNOSIS — S332XXA Dislocation of sacroiliac and sacrococcygeal joint, initial encounter: Secondary | ICD-10-CM | POA: Diagnosis not present

## 2019-10-28 DIAGNOSIS — M9904 Segmental and somatic dysfunction of sacral region: Secondary | ICD-10-CM | POA: Diagnosis not present

## 2019-11-03 DIAGNOSIS — G4700 Insomnia, unspecified: Secondary | ICD-10-CM | POA: Diagnosis not present

## 2019-11-03 DIAGNOSIS — Z79899 Other long term (current) drug therapy: Secondary | ICD-10-CM | POA: Diagnosis not present

## 2019-11-03 DIAGNOSIS — R269 Unspecified abnormalities of gait and mobility: Secondary | ICD-10-CM | POA: Diagnosis not present

## 2019-11-03 DIAGNOSIS — G609 Hereditary and idiopathic neuropathy, unspecified: Secondary | ICD-10-CM | POA: Diagnosis not present

## 2019-11-12 DIAGNOSIS — M9904 Segmental and somatic dysfunction of sacral region: Secondary | ICD-10-CM | POA: Diagnosis not present

## 2019-11-12 DIAGNOSIS — S332XXA Dislocation of sacroiliac and sacrococcygeal joint, initial encounter: Secondary | ICD-10-CM | POA: Diagnosis not present

## 2019-11-25 DIAGNOSIS — M9904 Segmental and somatic dysfunction of sacral region: Secondary | ICD-10-CM | POA: Diagnosis not present

## 2019-11-25 DIAGNOSIS — S332XXA Dislocation of sacroiliac and sacrococcygeal joint, initial encounter: Secondary | ICD-10-CM | POA: Diagnosis not present

## 2019-12-10 DIAGNOSIS — S332XXA Dislocation of sacroiliac and sacrococcygeal joint, initial encounter: Secondary | ICD-10-CM | POA: Diagnosis not present

## 2019-12-10 DIAGNOSIS — M9904 Segmental and somatic dysfunction of sacral region: Secondary | ICD-10-CM | POA: Diagnosis not present

## 2019-12-23 DIAGNOSIS — M9901 Segmental and somatic dysfunction of cervical region: Secondary | ICD-10-CM | POA: Diagnosis not present

## 2019-12-23 DIAGNOSIS — S332XXA Dislocation of sacroiliac and sacrococcygeal joint, initial encounter: Secondary | ICD-10-CM | POA: Diagnosis not present

## 2019-12-23 DIAGNOSIS — M9904 Segmental and somatic dysfunction of sacral region: Secondary | ICD-10-CM | POA: Diagnosis not present

## 2019-12-23 DIAGNOSIS — S13170A Subluxation of C6/C7 cervical vertebrae, initial encounter: Secondary | ICD-10-CM | POA: Diagnosis not present

## 2019-12-25 IMAGING — US US EXTREM LOW VENOUS*R*
1 series · 13 of 24 positions shown · non-contrast
Comparison: Prior duplex venous ultrasound 10/04/2017

CLINICAL DATA: 77-year-old male with a history of right lower
extremity popliteal DVT diagnosed in [REDACTED]



[Series 1: us extrem low venous*right* · 0.09mm/px · 13 of 36 slices shown]
[im 1/36]
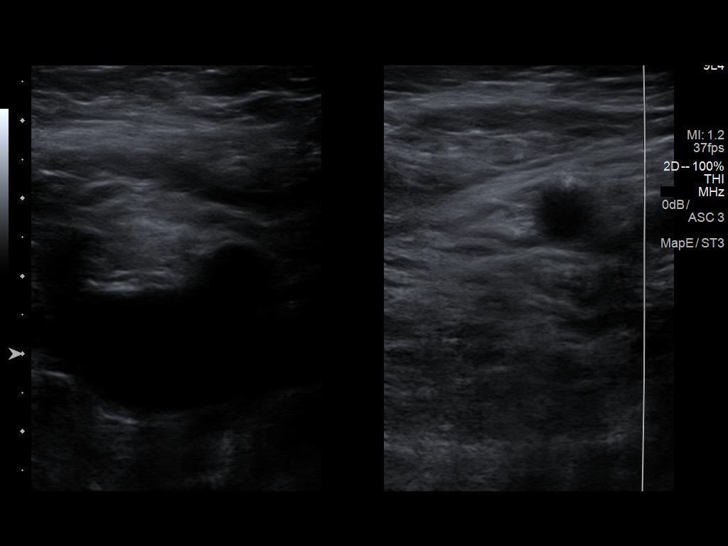
[im 4/36]
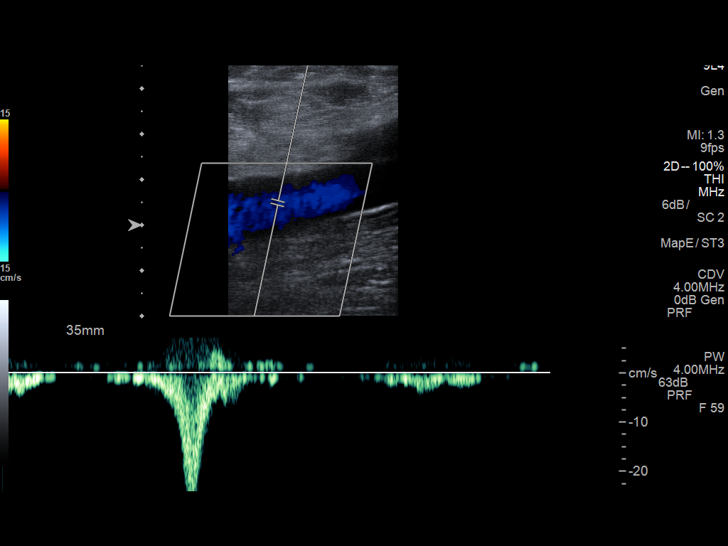
[im 7/36]
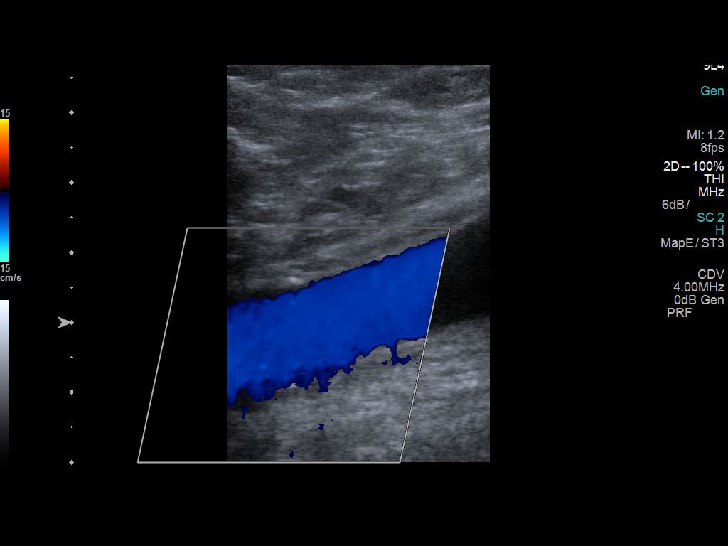
[im 10/36]
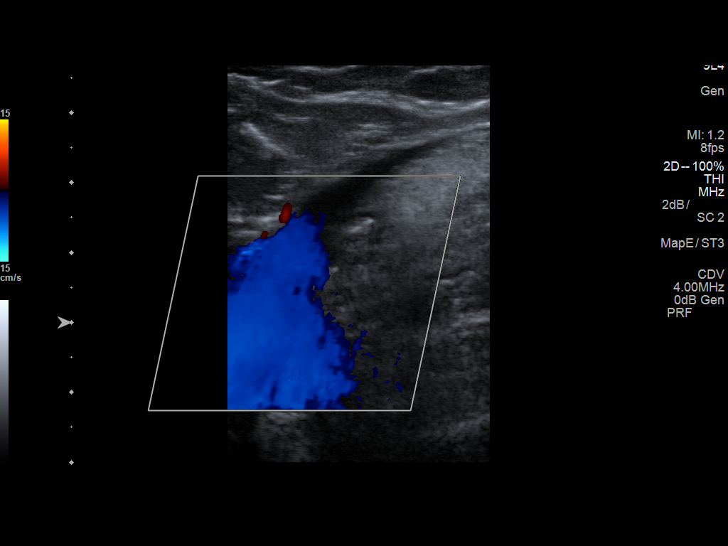
[im 13/36]
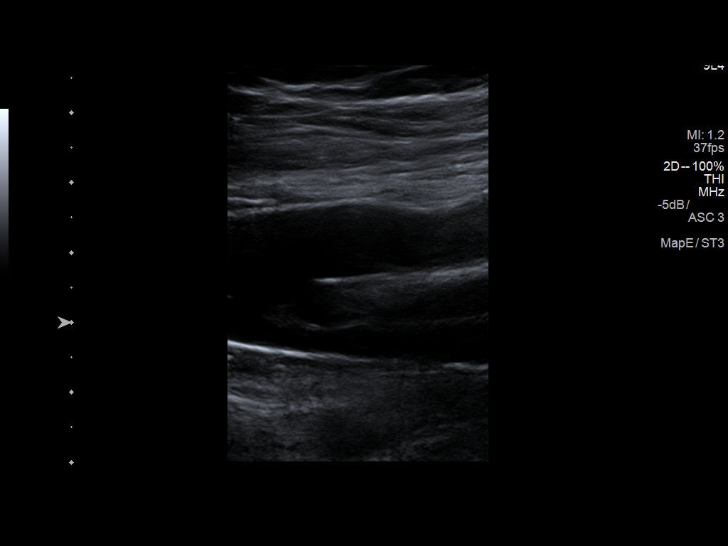
[im 16/36]
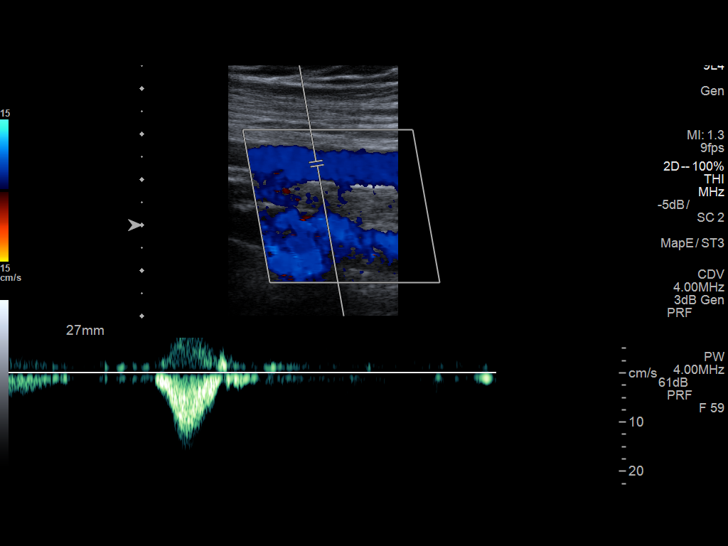
[im 19/36]
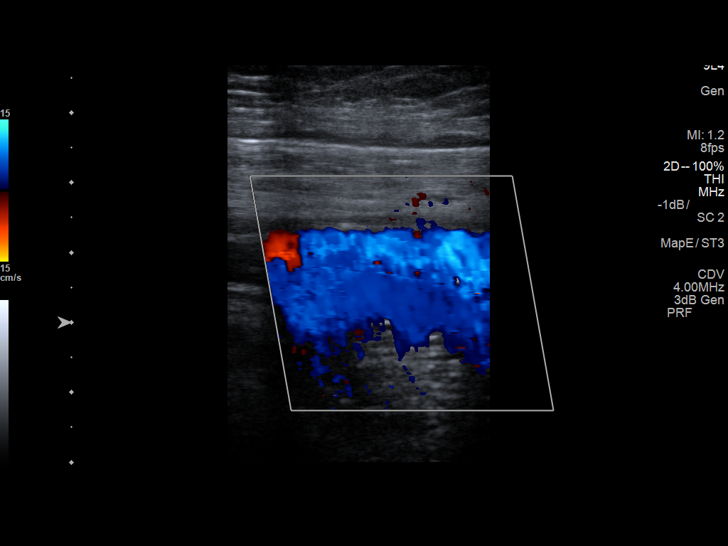
[im 20/36]
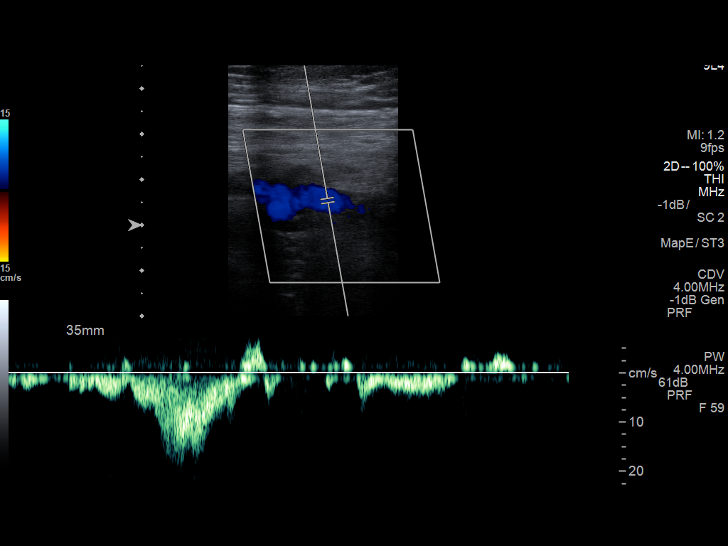
[im 23/36]
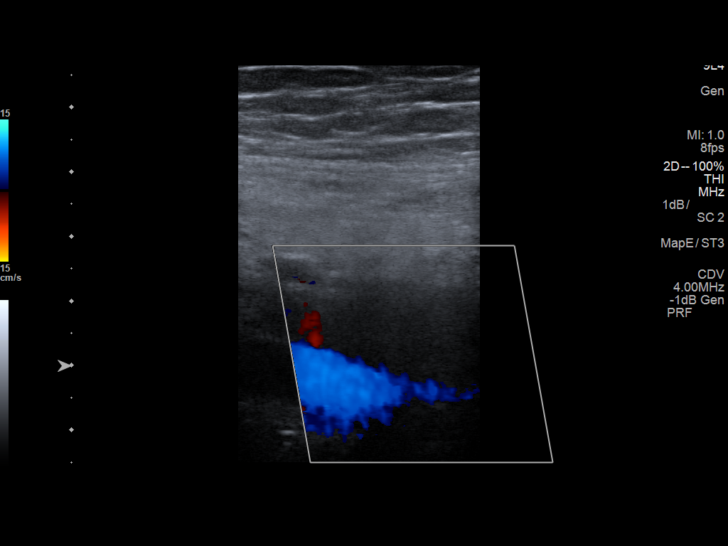
[im 26/36]
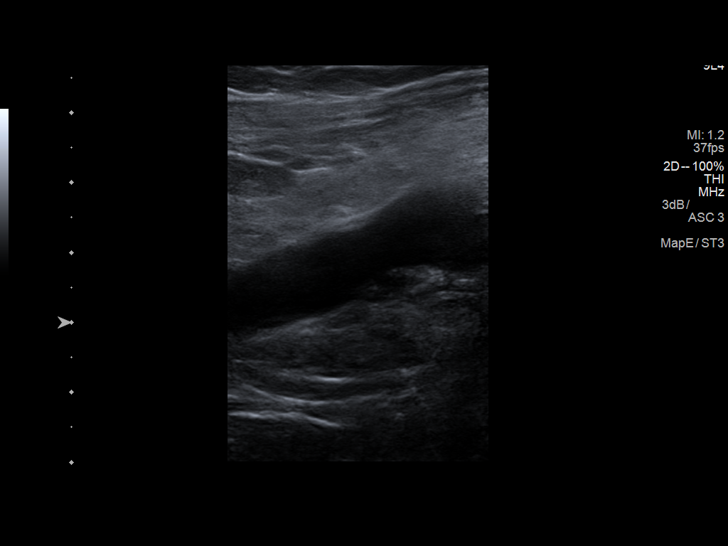
[im 29/36]
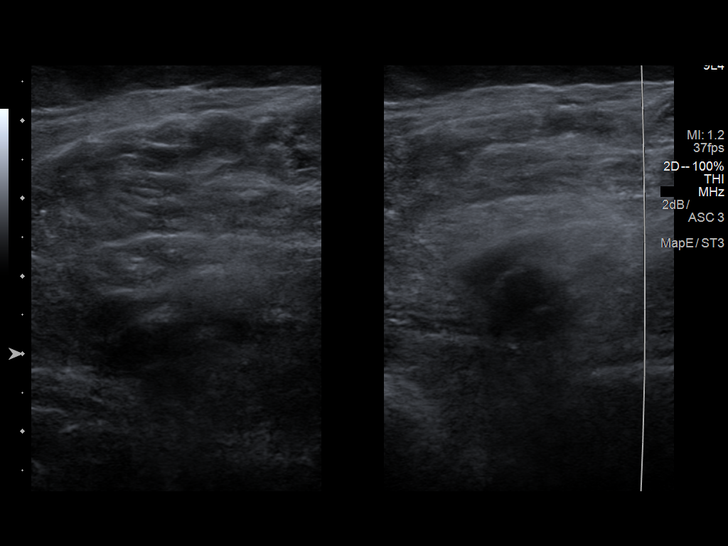
[im 32/36]
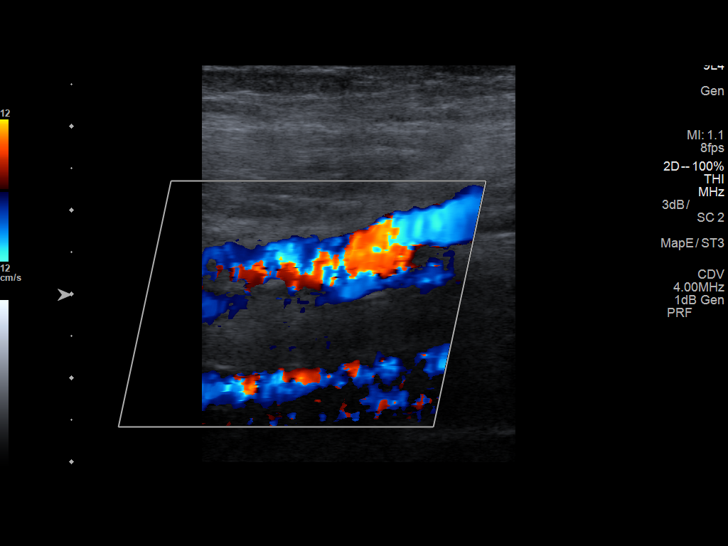
[im 36/36]
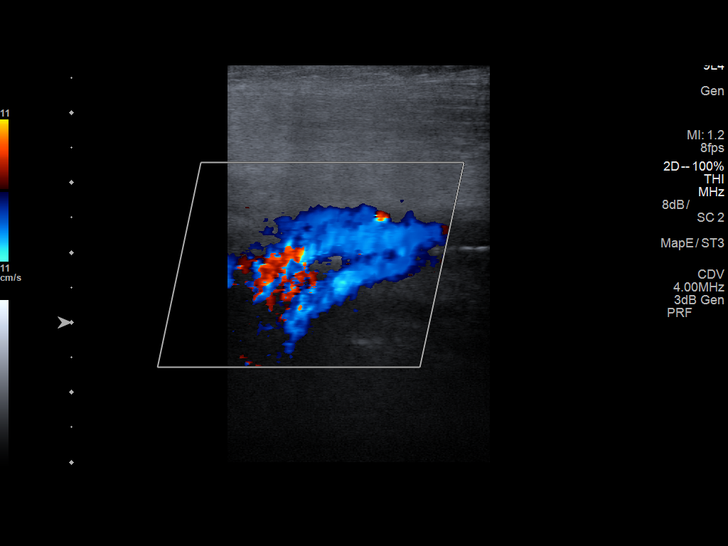

[13 of 24 positions shown; findings below may reference images not displayed]

FINDINGS: Contralateral Common Femoral Vein: Respiratory phasicity is normal
and symmetric with the symptomatic side. No evidence of thrombus.
Normal compressibility.

Common Femoral Vein: No evidence of thrombus. Normal
compressibility, respiratory phasicity and response to augmentation.

Saphenofemoral Junction: No evidence of thrombus. Normal
compressibility and flow on color Doppler imaging.

Profunda Femoral Vein: No evidence of thrombus. Normal
compressibility and flow on color Doppler imaging.

Femoral Vein: No evidence of thrombus. Normal compressibility,
respiratory phasicity and response to augmentation.

Popliteal Vein: No evidence of thrombus. Normal compressibility,
respiratory phasicity and response to augmentation.

Calf Veins: No evidence of thrombus. Normal compressibility and flow
on color Doppler imaging.

Superficial Great Saphenous Vein: No evidence of thrombus. Normal
compressibility.

Venous Reflux:  None.

Other Findings:  None.
IMPRESSION: No evidence of acute or residual deep venous thrombosis. The
previously noted popliteal DVT appears to have resolved.

## 2020-01-05 DIAGNOSIS — S13170A Subluxation of C6/C7 cervical vertebrae, initial encounter: Secondary | ICD-10-CM | POA: Diagnosis not present

## 2020-01-05 DIAGNOSIS — M9904 Segmental and somatic dysfunction of sacral region: Secondary | ICD-10-CM | POA: Diagnosis not present

## 2020-01-05 DIAGNOSIS — S332XXA Dislocation of sacroiliac and sacrococcygeal joint, initial encounter: Secondary | ICD-10-CM | POA: Diagnosis not present

## 2020-01-05 DIAGNOSIS — M9901 Segmental and somatic dysfunction of cervical region: Secondary | ICD-10-CM | POA: Diagnosis not present

## 2020-01-13 ENCOUNTER — Ambulatory Visit (INDEPENDENT_AMBULATORY_CARE_PROVIDER_SITE_OTHER): Payer: Medicare Other | Admitting: Orthopaedic Surgery

## 2020-01-13 ENCOUNTER — Other Ambulatory Visit: Payer: Self-pay

## 2020-01-13 ENCOUNTER — Encounter: Payer: Self-pay | Admitting: Orthopaedic Surgery

## 2020-01-13 ENCOUNTER — Ambulatory Visit (INDEPENDENT_AMBULATORY_CARE_PROVIDER_SITE_OTHER): Payer: Medicare Other

## 2020-01-13 VITALS — BP 111/74 | HR 54 | Ht 75.0 in | Wt 251.0 lb

## 2020-01-13 DIAGNOSIS — M9901 Segmental and somatic dysfunction of cervical region: Secondary | ICD-10-CM | POA: Diagnosis not present

## 2020-01-13 DIAGNOSIS — M898X1 Other specified disorders of bone, shoulder: Secondary | ICD-10-CM

## 2020-01-13 DIAGNOSIS — S13170A Subluxation of C6/C7 cervical vertebrae, initial encounter: Secondary | ICD-10-CM | POA: Diagnosis not present

## 2020-01-13 DIAGNOSIS — M9904 Segmental and somatic dysfunction of sacral region: Secondary | ICD-10-CM | POA: Diagnosis not present

## 2020-01-13 DIAGNOSIS — S332XXA Dislocation of sacroiliac and sacrococcygeal joint, initial encounter: Secondary | ICD-10-CM | POA: Diagnosis not present

## 2020-01-13 NOTE — Progress Notes (Signed)
Patient Norman Blake, male DOB:April 03, 1940, 80 y.o. [id number]  Chief Complaint  Patient presents with   Back Pain    f/u back pain, same   Shoulder Pain    knot on right shld, no injury, has been golfing    HPI  Norman Blake is a 79 y.o. male who has developed a  "knot" of the right shoulder over the distal clavicle over the last three months.  It does not hurt.  It is not growing.  It is not red.  He has minimal discomfort.  He has no trauma.   Body mass index is 31.37 kg/m.  ROS  Review of Systems  Constitutional: Positive for activity change.  Musculoskeletal: Positive for arthralgias, back pain and gait problem.  All other systems reviewed and are negative.   All other systems reviewed and are negative.  The following is a summary of the past history medically, past history surgically, known current medicines, social history and family history.  This information is gathered electronically by the computer from prior information and documentation.  I review this each visit and have found including this information at this point in the chart is beneficial and informative.    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    Past Surgical History:  Procedure Laterality Date   CHOLECYSTECTOMY     COLONOSCOPY N/A 06/09/2014   Procedure: COLONOSCOPY;  Surgeon: Aviva Signs Md, MD;  Location[facility];  Service: Gastroenterology;  Laterality: N/A;    Family History  Problem Relation Age of Onset   Breast cancer Mother    Cancer Father     Social History Social History   Tobacco Use   Smoking status: Former Smoker    Packs/day: 0.50    Years: 3.00    Pack years: 1.50    Quit date: 06/09/1958    Years since quitting: 61.6   Smokeless tobacco: Never Used  Substance Use Topics   Alcohol use: No   Drug use: No     Allergies  Allergen Reactions   Atorvastatin Calcium Hives and Other (See Comments)   Gabapentin Other (See Comments)    Current Outpatient Medications  Medication Sig Dispense Refill   DULoxetine (CYMBALTA) 60 MG capsule Take 60 mg by mouth daily.     finasteride (PROSCAR) 5 MG tablet Take 5 mg by mouth at bedtime.     latanoprost (XALATAN) 0.005 % ophthalmic solution Place 1 drop into both eyes at bedtime.     modafinil (PROVIGIL) 100 MG tablet Take 100 mg by mouth daily.     Multiple Vitamin (MULTIVITAMIN) tablet Take 1 tablet by mouth daily.     predniSONE (STERAPRED UNI-PAK 21 TAB) 5 MG (21) TBPK tablet Take 6 pills first day; 5 pills second day; 4 pills third day; 3 pills fourth day; 2 pills next day and 1 pill last day. 21 tablet 0   pregabalin (LYRICA) 150 MG capsule Take 150 mg by mouth 2 (two) times daily.     Rivaroxaban 15 & 20 MG TBPK Take as directed on package: Start with one 15mg  tablet by mouth twice a day with food. On Day 22, switch to one 20mg  tablet once a day with food. 51 each 0   simvastatin (ZOCOR) 80 MG tablet Take 40 mg by mouth at bedtime.  tamsulosin (FLOMAX) 0.4 MG CAPS capsule Take 0.4 mg by mouth daily.     timolol (TIMOPTIC) 0.5 % ophthalmic solution 1 drop 2 (two) times daily.     traMADol (ULTRAM) 50 MG tablet Take 50 mg by mouth 4 (four) times daily.     traZODone (DESYREL) 150 MG tablet Take 150 mg by mouth at bedtime.     No current facility-administered medications for this visit.     Physical Exam  Blood pressure 111/74, pulse (!) 54, height 6\' 3"  (1.905 m), weight 251 lb (113.9 kg).  Constitutional: overall normal hygiene, normal nutrition, well developed, normal grooming, normal body habitus. Assistive device:none  Musculoskeletal: gait and station Limp none, muscle tone and strength are normal, no tremors or atrophy is present.  .  Neurological: coordination overall normal.  Deep tendon reflex/nerve stretch intact.   Sensation normal.  Cranial nerves II-XII intact.   Skin:   Normal overall no scars, lesions, ulcers or rashes. No psoriasis.  Psychiatric: Alert and oriented x 3.  Recent memory intact, remote memory unclear.  Normal mood and affect. Well groomed.  Good eye contact.  Cardiovascular: overall no swelling, no varicosities, no edema bilaterally, normal temperatures of the legs and arms, no clubbing, cyanosis and good capillary refill.  Lymphatic: palpation is normal.  ROM of the right shoulder is full. He has a lesion, more like a sebaceous cyst, over the distal right clavicle, dorsally, that is not red, not fluctuant.  It measures about 3 x 4 cm.  It is not moveable and not painful.   All other systems reviewed and are negative   The patient has been educated about the nature of the problem(s) and counseled on treatment options.  The patient appeared to understand what I have discussed and is in agreement with it.  Encounter Diagnosis  Name Primary?   Pain of right clavicle Yes    PLAN Call if any problems.  Precautions discussed.  Continue current medications.   Return to clinic 3 months   Watch the "knot" and call if it hurts or gets bigger or is red.  Electronically Signed Sanjuana Kava, MD 10/26/20219:16 AM

## 2020-01-28 DIAGNOSIS — E782 Mixed hyperlipidemia: Secondary | ICD-10-CM | POA: Diagnosis not present

## 2020-01-28 DIAGNOSIS — Z0001 Encounter for general adult medical examination with abnormal findings: Secondary | ICD-10-CM | POA: Diagnosis not present

## 2020-01-28 DIAGNOSIS — R7301 Impaired fasting glucose: Secondary | ICD-10-CM | POA: Diagnosis not present

## 2020-01-28 DIAGNOSIS — Z0000 Encounter for general adult medical examination without abnormal findings: Secondary | ICD-10-CM | POA: Diagnosis not present

## 2020-01-28 DIAGNOSIS — Z0189 Encounter for other specified special examinations: Secondary | ICD-10-CM | POA: Diagnosis not present

## 2020-01-28 DIAGNOSIS — M79661 Pain in right lower leg: Secondary | ICD-10-CM | POA: Diagnosis not present

## 2020-02-02 DIAGNOSIS — Z23 Encounter for immunization: Secondary | ICD-10-CM | POA: Diagnosis not present

## 2020-02-03 DIAGNOSIS — M9901 Segmental and somatic dysfunction of cervical region: Secondary | ICD-10-CM | POA: Diagnosis not present

## 2020-02-03 DIAGNOSIS — M9904 Segmental and somatic dysfunction of sacral region: Secondary | ICD-10-CM | POA: Diagnosis not present

## 2020-02-03 DIAGNOSIS — S13170A Subluxation of C6/C7 cervical vertebrae, initial encounter: Secondary | ICD-10-CM | POA: Diagnosis not present

## 2020-02-03 DIAGNOSIS — S332XXA Dislocation of sacroiliac and sacrococcygeal joint, initial encounter: Secondary | ICD-10-CM | POA: Diagnosis not present

## 2020-02-20 DIAGNOSIS — R972 Elevated prostate specific antigen [PSA]: Secondary | ICD-10-CM | POA: Diagnosis not present

## 2020-03-01 DIAGNOSIS — N3943 Post-void dribbling: Secondary | ICD-10-CM | POA: Diagnosis not present

## 2020-03-01 DIAGNOSIS — R972 Elevated prostate specific antigen [PSA]: Secondary | ICD-10-CM | POA: Diagnosis not present

## 2020-03-01 DIAGNOSIS — N401 Enlarged prostate with lower urinary tract symptoms: Secondary | ICD-10-CM | POA: Diagnosis not present

## 2020-04-13 ENCOUNTER — Ambulatory Visit (INDEPENDENT_AMBULATORY_CARE_PROVIDER_SITE_OTHER): Payer: Medicare Other | Admitting: Orthopaedic Surgery

## 2020-04-13 ENCOUNTER — Encounter: Payer: Self-pay | Admitting: Orthopaedic Surgery

## 2020-04-13 ENCOUNTER — Other Ambulatory Visit: Payer: Self-pay

## 2020-04-13 VITALS — BP 137/84 | HR 59 | Ht 75.0 in | Wt 254.0 lb

## 2020-04-13 DIAGNOSIS — M898X1 Other specified disorders of bone, shoulder: Secondary | ICD-10-CM

## 2020-04-13 NOTE — Progress Notes (Signed)
I am much better  The right clavicle pain and the "knot" he had has resolved.  He has no pain today.  His neck pain is also better.  He has full ROM of the right shoulder and neck.  NV intact.  Encounter Diagnosis  Name Primary?  . Pain of right clavicle Yes   I will see in three months.  Call if any problem.  Precautions discussed.   Electronically Signed Sanjuana Kava, MD 1/25/20228:52 AM

## 2020-04-20 ENCOUNTER — Other Ambulatory Visit: Payer: Self-pay

## 2020-04-20 ENCOUNTER — Ambulatory Visit (INDEPENDENT_AMBULATORY_CARE_PROVIDER_SITE_OTHER): Payer: Medicare Other | Admitting: General Surgery

## 2020-04-20 ENCOUNTER — Encounter: Payer: Self-pay | Admitting: General Surgery

## 2020-04-20 VITALS — BP 110/68 | HR 78 | Temp 97.4°F | Resp 16 | Ht 75.0 in | Wt 258.0 lb

## 2020-04-20 DIAGNOSIS — K649 Unspecified hemorrhoids: Secondary | ICD-10-CM | POA: Diagnosis not present

## 2020-04-20 NOTE — Patient Instructions (Signed)

## 2020-04-21 NOTE — Progress Notes (Signed)
Norman Blake; [id number]; 12-Nov-1940   HPI Patient is a 80 year old white male who was referred to my care by Dr. [place] Crane for blood per rectum.  Patient states that approximately 4 to 5 weeks ago, he had an episode of blood in the toilet bowl.  He states this occurred intermittently for approximately 1 week.  Over the past few weeks, he has not noted any blood in his stools.  He denies any fever or chills.  He last had a colonoscopy in 2016. Past Medical History:  Diagnosis Date  . Depression   . History of hiatal hernia   . 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 CHOLECYSTECTOMY    . COLONOSCOPY N/A 06/09/2014   Procedure: COLONOSCOPY;  Surgeon: Aviva Signs Md, MD;  [facility]: [facility];  Service: Gastroenterology;  Laterality: N/A;    Family History  Problem Relation Age of Onset  . Breast cancer Mother   . Cancer Father     Current Outpatient Medications on File Prior to Visit  Medication Sig Dispense Refill  . DULoxetine (CYMBALTA) 60 MG capsule Take 60 mg by mouth daily.    . finasteride (PROSCAR) 5 MG tablet Take 5 mg by mouth at bedtime.    Marland Kitchen latanoprost (XALATAN) 0.005 % ophthalmic solution Place 1 drop into both eyes at bedtime.    . modafinil (PROVIGIL) 100 MG tablet Take 100 mg by mouth daily.    . Multiple Vitamin (MULTIVITAMIN) tablet Take 1 tablet by mouth daily.    . pregabalin (LYRICA) 150 MG capsule Take 150 mg by mouth 2 (two) times daily.    . simvastatin (ZOCOR) 80 MG tablet Take 40 mg by mouth at bedtime.    . tamsulosin (FLOMAX) 0.4 MG CAPS capsule Take 0.4 mg by mouth daily.    . timolol (TIMOPTIC) 0.5 % ophthalmic solution 1 drop 2 (two) times daily.    . traMADol (ULTRAM) 50 MG tablet Take 50 mg by mouth 4 (four) times daily.    . traZODone (DESYREL) 150 MG tablet Take 150 mg by mouth at bedtime.     No current facility-administered medications on  file prior to visit.    Allergies  Allergen Reactions  . Atorvastatin Calcium Hives and Other (See Comments)  . Gabapentin Other (See Comments)    Social History   Substance and Sexual Activity  Alcohol Use No    Social History   Tobacco Use  Smoking Status Former Smoker  . Packs/day: 0.50  . Years: 3.00  . Pack years: 1.50  . Quit date: 06/09/1958  . Years since quitting: 61.9  Smokeless Tobacco Never Used    Review of Systems  Constitutional: Positive for malaise/fatigue.  HENT: Negative.   Eyes: Negative.   Respiratory: Negative.   Cardiovascular: Negative.   Gastrointestinal: Negative.   Genitourinary: Negative.   Musculoskeletal: Positive for back pain and joint pain.  Skin: Negative.   Neurological: Negative.   Endo/Heme/Allergies: Negative.   Psychiatric/Behavioral: Negative.     Objective   Vitals:   04/20/20 1401  BP: 110/68  Pulse: 78  Resp: 16  Temp: (!) 97.4 F (36.3 C)  SpO2: 93%    Physical Exam Vitals reviewed.  Constitutional:      Appearance: Normal appearance. He is not ill-appearing.  HENT:     Head: Normocephalic and atraumatic.  Cardiovascular:     Rate and Rhythm:  Normal rate and regular rhythm.     Heart sounds: Normal heart sounds. No murmur heard. No friction rub. No gallop.   Pulmonary:     Effort: Pulmonary effort is normal. No respiratory distress.     Breath sounds: Normal breath sounds. No stridor. No wheezing, rhonchi or rales.  Abdominal:     General: There is no distension.     Palpations: Abdomen is soft. There is no mass.     Tenderness: There is no abdominal tenderness. There is no guarding or rebound.     Hernia: No hernia is present.  Genitourinary:    Comments: Residual small hemorrhoid noted at the 5 o'clock position with evidence of rupture that is healing.  No thrombosis present.  No active bleeding noted. Skin:    General: Skin is warm and dry.  Neurological:     Mental Status: He is alert and  oriented to person, place, and time.     Assessment  Bleeding hemorrhoidal disease, resolved Plan   It appears the patient had an episode of an internal hemorrhoid that bled, but is now stopped.  No need for further intervention at this time.  Patient was instructed to return to my care should the bleeding recur.  Follow-up as needed.

## 2020-07-13 ENCOUNTER — Other Ambulatory Visit: Payer: Self-pay

## 2020-07-13 ENCOUNTER — Ambulatory Visit (INDEPENDENT_AMBULATORY_CARE_PROVIDER_SITE_OTHER): Payer: Medicare Other | Admitting: Orthopaedic Surgery

## 2020-07-13 ENCOUNTER — Encounter: Payer: Self-pay | Admitting: Orthopaedic Surgery

## 2020-07-13 VITALS — BP 111/68 | HR 52 | Ht 75.0 in | Wt 252.0 lb

## 2020-07-13 DIAGNOSIS — G8929 Other chronic pain: Secondary | ICD-10-CM | POA: Diagnosis not present

## 2020-07-13 DIAGNOSIS — M25512 Pain in left shoulder: Secondary | ICD-10-CM

## 2020-07-13 NOTE — Progress Notes (Signed)
Patient Norman Blake, male DOB:[birth year], 80 y.o. [id number]  Chief Complaint  Patient presents with  . Shoulder Pain    Right shoulder pain, Left arm is sore today, has trouble raising this one.     HPI  Norman Blake is a 80 y.o. male who has shoulder pain.  The left hurts more today and the right.  He has used a cream on it.  He plays golf and it hurts more after playing.  I have suggested he add ice also.  He has no new trauma, no redness or swelling or numbness.   Body mass index is 31.5 kg/m.  ROS  Review of Systems  Constitutional: Positive for activity change.  Musculoskeletal: Positive for arthralgias, back pain and gait problem.  All other systems reviewed and are negative.   All other systems reviewed and are negative.  The following is a summary of the past history medically, past history surgically, known current medicines, social history and family history.  This information is gathered electronically by the computer from prior information and documentation.  I review this each visit and have found including this information at this point in the chart is beneficial and informative.    Past Medical History:  Diagnosis Date  . Depression   . History of hiatal hernia   . 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 CHOLECYSTECTOMY    . COLONOSCOPY N/A 06/09/2014   Procedure: COLONOSCOPY;  Surgeon: Aviva Signs Md, MD;  [facility]: [facility];  Service: Gastroenterology;  Laterality: N/A;    Family History  Problem Relation Age of Onset  . Breast cancer Mother   . Cancer Father     Social History Social History   Tobacco Use  . Smoking status: Former Smoker    Packs/day: 0.50    Years: 3.00    Pack years: 1.50    Quit date: 06/09/1958    Years since quitting: 62.1  . Smokeless tobacco: Never Used  Substance Use Topics  . Alcohol use: No   . Drug use: No    Allergies  Allergen Reactions  . Atorvastatin Calcium Hives and Other (See Comments)  . Gabapentin Other (See Comments)    Current Outpatient Medications  Medication Sig Dispense Refill  . DULoxetine (CYMBALTA) 60 MG capsule Take 60 mg by mouth daily.    . finasteride (PROSCAR) 5 MG tablet Take 5 mg by mouth at bedtime.    Marland Kitchen latanoprost (XALATAN) 0.005 % ophthalmic solution Place 1 drop into both eyes at bedtime.    . modafinil (PROVIGIL) 100 MG tablet Take 100 mg by mouth daily.    . Multiple Vitamin (MULTIVITAMIN) tablet Take 1 tablet by mouth daily.    . pregabalin (LYRICA) 150 MG capsule Take 150 mg by mouth 2 (two) times daily.    . simvastatin (ZOCOR) 80 MG tablet Take 40 mg by mouth at bedtime.    . tamsulosin (FLOMAX) 0.4 MG CAPS capsule Take 0.4 mg by mouth daily.    . timolol (TIMOPTIC) 0.5 % ophthalmic solution 1 drop 2 (two) times daily.    . traMADol (ULTRAM) 50 MG tablet Take 50 mg by mouth 4 (four) times daily.    . traZODone (DESYREL) 150 MG tablet Take 150 mg by mouth at bedtime.     No current facility-administered medications for this visit.     Physical Exam  Blood pressure 111/68, pulse (!) 52, height 6\' 3"  (1.905 m), weight 252 lb (114.3 kg).  Constitutional: overall normal hygiene, normal nutrition, well developed, normal grooming, normal body habitus. Assistive device:none  Musculoskeletal: gait and station Limp none, muscle tone and strength are normal, no tremors or atrophy is present.  .  Neurological: coordination overall normal.  Deep tendon reflex/nerve stretch intact.  Sensation normal.  Cranial nerves II-XII intact.   Skin:   Normal overall no scars, lesions, ulcers or rashes. No psoriasis.  Psychiatric: Alert and oriented x 3.  Recent memory intact, remote memory unclear.  Normal mood and affect. Well groomed.  Good eye contact.  Cardiovascular: overall no swelling, no varicosities, no edema bilaterally, normal  temperatures of the legs and arms, no clubbing, cyanosis and good capillary refill.  Lymphatic: palpation is normal.  Both shoulder has good ROM but pain in the extremes.  NV intact.  All other systems reviewed and are negative   The patient has been educated about the nature of the problem(s) and counseled on treatment options.  The patient appeared to understand what I have discussed and is in agreement with it.  Encounter Diagnosis  Name Primary?  . Chronic left shoulder pain Yes    PLAN Call if any problems.  Precautions discussed.  Continue current medications.   Return to clinic 3 months   Electronically Signed Sanjuana Kava, MD 4/26/20229:03 AM

## 2020-07-31 DIAGNOSIS — E782 Mixed hyperlipidemia: Secondary | ICD-10-CM | POA: Insufficient documentation

## 2020-08-03 ENCOUNTER — Other Ambulatory Visit: Payer: Self-pay

## 2020-08-03 ENCOUNTER — Ambulatory Visit (INDEPENDENT_AMBULATORY_CARE_PROVIDER_SITE_OTHER): Payer: Medicare Other | Admitting: Orthopaedic Surgery

## 2020-08-03 ENCOUNTER — Encounter: Payer: Self-pay | Admitting: Orthopaedic Surgery

## 2020-08-03 VITALS — BP 128/59 | HR 58 | Ht 75.0 in | Wt 250.0 lb

## 2020-08-03 DIAGNOSIS — M25512 Pain in left shoulder: Secondary | ICD-10-CM | POA: Diagnosis not present

## 2020-08-03 DIAGNOSIS — G8929 Other chronic pain: Secondary | ICD-10-CM | POA: Diagnosis not present

## 2020-08-03 NOTE — Progress Notes (Signed)
PROCEDURE NOTE:  The patient request injection, verbal consent was obtained.  The left shoulder was prepped appropriately after time out was performed.   Sterile technique was observed and injection of 1 cc of Celestone 6 mg with several cc's of plain xylocaine. Anesthesia was provided by ethyl chloride and a 20-gauge needle was used to inject the shoulder area. A posterior approach was used.  The injection was tolerated well.  A band aid dressing was applied.  The patient was advised to apply ice later today and tomorrow to the injection sight as needed.  Keep regular appointment.  Electronically Signed Sanjuana Kava, MD 5/17/20229:36 AM

## 2020-08-09 DIAGNOSIS — E669 Obesity, unspecified: Secondary | ICD-10-CM | POA: Insufficient documentation

## 2020-08-09 DIAGNOSIS — G4700 Insomnia, unspecified: Secondary | ICD-10-CM | POA: Insufficient documentation

## 2020-08-09 DIAGNOSIS — N400 Enlarged prostate without lower urinary tract symptoms: Secondary | ICD-10-CM | POA: Insufficient documentation

## 2020-08-09 DIAGNOSIS — F4310 Post-traumatic stress disorder, unspecified: Secondary | ICD-10-CM | POA: Insufficient documentation

## 2020-10-12 ENCOUNTER — Ambulatory Visit (INDEPENDENT_AMBULATORY_CARE_PROVIDER_SITE_OTHER): Payer: Medicare Other | Admitting: Orthopaedic Surgery

## 2020-10-12 ENCOUNTER — Encounter: Payer: Self-pay | Admitting: Orthopaedic Surgery

## 2020-10-12 ENCOUNTER — Other Ambulatory Visit: Payer: Self-pay

## 2020-10-12 DIAGNOSIS — M25512 Pain in left shoulder: Secondary | ICD-10-CM

## 2020-10-12 DIAGNOSIS — G8929 Other chronic pain: Secondary | ICD-10-CM

## 2020-10-12 NOTE — Progress Notes (Signed)
PROCEDURE NOTE:  The patient request injection, verbal consent was obtained.  The left shoulder was prepped appropriately after time out was performed.   Sterile technique was observed and injection of 1 cc of Celestone 6 mg with several cc's of plain xylocaine. Anesthesia was provided by ethyl chloride and a 20-gauge needle was used to inject the shoulder area. A posterior approach was used.  The injection was tolerated well.  A band aid dressing was applied.  The patient was advised to apply ice later today and tomorrow to the injection sight as needed.   Return in two months.  Call if any problem.  Precautions discussed.  Electronically Signed Sanjuana Kava, MD 7/26/20228:37 AM

## 2020-12-12 DIAGNOSIS — D72829 Elevated white blood cell count, unspecified: Secondary | ICD-10-CM | POA: Insufficient documentation

## 2020-12-13 DIAGNOSIS — C4491 Basal cell carcinoma of skin, unspecified: Secondary | ICD-10-CM | POA: Insufficient documentation

## 2020-12-14 ENCOUNTER — Ambulatory Visit (INDEPENDENT_AMBULATORY_CARE_PROVIDER_SITE_OTHER): Payer: Medicare Other | Admitting: Orthopaedic Surgery

## 2020-12-14 ENCOUNTER — Encounter: Payer: Self-pay | Admitting: Orthopaedic Surgery

## 2020-12-14 ENCOUNTER — Other Ambulatory Visit: Payer: Self-pay

## 2020-12-14 VITALS — BP 141/85 | HR 54 | Ht 75.0 in | Wt 248.4 lb

## 2020-12-14 DIAGNOSIS — M25512 Pain in left shoulder: Secondary | ICD-10-CM

## 2020-12-14 DIAGNOSIS — G8929 Other chronic pain: Secondary | ICD-10-CM | POA: Diagnosis not present

## 2020-12-14 NOTE — Progress Notes (Signed)
My shoulder is doing OK  He has had less pain of the left shoulder recently.  He still has good and bad days.  He plays golf.  He has no new trauma, no numbness.  He is taking his medicine.  ROM of the left shoulder is full with pain in the extremes.  NV intact.  Encounter Diagnosis  Name Primary?   Chronic left shoulder pain Yes   I will see in two months.  Continue exercises and medicine.  Call if any problem.  Precautions discussed.   Electronically Signed Sanjuana Kava, MD 9/27/20229:28 AM

## 2021-02-15 ENCOUNTER — Other Ambulatory Visit: Payer: Self-pay

## 2021-02-15 ENCOUNTER — Encounter: Payer: Self-pay | Admitting: Orthopaedic Surgery

## 2021-02-15 ENCOUNTER — Ambulatory Visit (INDEPENDENT_AMBULATORY_CARE_PROVIDER_SITE_OTHER): Payer: Medicare Other | Admitting: Orthopaedic Surgery

## 2021-02-15 DIAGNOSIS — G8929 Other chronic pain: Secondary | ICD-10-CM

## 2021-02-15 DIAGNOSIS — M25512 Pain in left shoulder: Secondary | ICD-10-CM

## 2021-02-15 NOTE — Progress Notes (Signed)
My shoulder is not as sore as it has been  He has less pain in the left shoulder and better motion.  It still hurts but he is doing his exercises.  He has no new trauma, no numbness.  ROM of the left shoulder is full with pain in the extremes.  NV intact.  Encounter Diagnosis  Name Primary?   Chronic left shoulder pain Yes   Continue as he is doing.  Return in three months.  Call if any problem.  Precautions discussed.  Electronically Signed Sanjuana Kava, MD 11/29/20228:32 AM

## 2021-05-17 ENCOUNTER — Ambulatory Visit: Payer: Medicare Other | Admitting: Orthopaedic Surgery

## 2021-05-24 ENCOUNTER — Ambulatory Visit (INDEPENDENT_AMBULATORY_CARE_PROVIDER_SITE_OTHER): Payer: Medicare Other | Admitting: Orthopaedic Surgery

## 2021-05-24 ENCOUNTER — Other Ambulatory Visit: Payer: Self-pay

## 2021-05-24 ENCOUNTER — Encounter: Payer: Self-pay | Admitting: Orthopaedic Surgery

## 2021-05-24 VITALS — BP 123/76 | HR 55 | Ht 74.0 in | Wt 257.0 lb

## 2021-05-24 DIAGNOSIS — M65311 Trigger thumb, right thumb: Secondary | ICD-10-CM | POA: Diagnosis not present

## 2021-05-24 DIAGNOSIS — G8929 Other chronic pain: Secondary | ICD-10-CM

## 2021-05-24 DIAGNOSIS — M25512 Pain in left shoulder: Secondary | ICD-10-CM

## 2021-05-24 NOTE — Progress Notes (Signed)
My thumb is locking.  My left shoulder needs a shot. ? ?He has developed a trigger thumb on the left that locks in extension.  It is painful and awakens him.  He has no trauma, no numbness.  He cannot play golf well with this. ? ?I have explained what a trigger thumb is and that surgery is the definitive treatment.  I will try injection today. ? ?His left shoulder is more painful recently.  He has pain rolling on it at night and lifting his arm overhead.  He has no numbness, no new trauma. ? ?Right thumb has locking at the A1 pulley.  NV intact. ? ?Examination of left Upper Extremity is done. ? Inspection: ?  Overall:  Elbow non-tender without crepitus or defects, forearm non-tender without crepitus or defects, wrist non-tender without crepitus or defects, hand non-tender.  ?  Shoulder: with glenohumeral joint tenderness, without effusion. ?  Upper arm:  without swelling and tenderness ? ? Range of motion: ?  Overall:  Full range of motion of the elbow, full range of motion of wrist and full range of motion in fingers. ?  Shoulder:  left  145 degrees forward flexion; 120 degrees abduction; 20 degrees internal rotation, 25 degrees external rotation, 10 degrees extension, 40 degrees adduction. ? ? Stability: ?  Overall:  Shoulder, elbow and wrist stable ? ? Strength and Tone: ?  Overall full shoulder muscles strength, full upper arm strength and normal upper arm bulk and tone.  ? ?Encounter Diagnoses  ?Name Primary?  ? Trigger thumb, right thumb Yes  ? Chronic left shoulder pain   ? ?PROCEDURE ? ?Trigger Finger Injection ? ?The right Thumb  has been locking at the A1 pulley.  The patient has been told about injection of the digit.  Surgical correction and excision of the A1 pulley will resolve the problem.  Ani injection in the digit should help but the results may be short lived.  The patient asked appropriate questions and understands the procedure.  The patient has elected for an injection at this time.  Verbal  consent was obtained.  A timeout was taken to confirm the proper hand and digit. ? ?Medication ? 1 mL of DepoMedrol 40 mg ? 2 mL of 1% lidocaine plain ? ?Ethyl chloride for anesthesia ? ?Alcohol was used to prepare the skin along with ethyl chloride and then the injection was made at the A1 pulley there were no complications.  It was tolerated well. ? ?A Band-aid dressing was applied. ? ?Call if any problem or difficulty.  ? ?PROCEDURE NOTE: ? ?The patient request injection, verbal consent was obtained. ? ?The left shoulder was prepped appropriately after time out was performed.  ? ?Sterile technique was observed and injection of 1 cc of DepoMedrol '40mg'$  with several cc's of plain xylocaine. Anesthesia was provided by ethyl chloride and a 20-gauge needle was used to inject the shoulder area. A posterior approach was used.  The injection was tolerated well. ? ?A band aid dressing was applied. ? ?The patient was advised to apply ice later today and tomorrow to the injection sight as needed. ? ?Return in one month.  Call and cancel if better. ? ?Call if any problem. ? ?Precautions discussed. ? ?Electronically Signed ?Sanjuana Kava, MD ?3/7/20239:59 AM ? ? ?

## 2021-06-14 ENCOUNTER — Other Ambulatory Visit (HOSPITAL_COMMUNITY): Payer: Self-pay | Admitting: Family Medicine

## 2021-06-14 DIAGNOSIS — R252 Cramp and spasm: Secondary | ICD-10-CM

## 2021-06-14 DIAGNOSIS — G629 Polyneuropathy, unspecified: Secondary | ICD-10-CM | POA: Insufficient documentation

## 2021-06-21 ENCOUNTER — Encounter: Payer: Self-pay | Admitting: Orthopaedic Surgery

## 2021-06-21 ENCOUNTER — Ambulatory Visit (INDEPENDENT_AMBULATORY_CARE_PROVIDER_SITE_OTHER): Payer: Medicare Other | Admitting: Orthopaedic Surgery

## 2021-06-21 VITALS — BP 114/66 | HR 52 | Ht 75.0 in | Wt 248.0 lb

## 2021-06-21 DIAGNOSIS — G8929 Other chronic pain: Secondary | ICD-10-CM | POA: Diagnosis not present

## 2021-06-21 DIAGNOSIS — M65311 Trigger thumb, right thumb: Secondary | ICD-10-CM | POA: Diagnosis not present

## 2021-06-21 DIAGNOSIS — M25512 Pain in left shoulder: Secondary | ICD-10-CM

## 2021-06-21 NOTE — Progress Notes (Signed)
My thumb is much better ? ?His right thumb is not triggering since the injection.  He has full motion now.  I have told him that surgery is the definitive treatment. ? ?He has some left Achilles tendon tightness.  I have told him about stretching. ? ?Right thumb has full motion, NV intact. ? ?Left Achilles has no defect.  He is tight.  NV intact. ? ?Encounter Diagnoses  ?Name Primary?  ? Trigger thumb, right thumb Yes  ? Chronic left shoulder pain   ? ?I will see on June 6 right before he goes out for vacation. ? ?Call if any problem. ? ?Precautions discussed. ? ?Electronically Signed ?Sanjuana Kava, MD ?4/4/20238:45 AM ? ?

## 2021-06-23 ENCOUNTER — Ambulatory Visit (HOSPITAL_COMMUNITY)
Admission: RE | Admit: 2021-06-23 | Discharge: 2021-06-23 | Disposition: A | Discharge status: Home / Self Care | Payer: Medicare Other | Source: Ambulatory Visit | Attending: Family Medicine | Admitting: Family Medicine

## 2021-06-23 DIAGNOSIS — R252 Cramp and spasm: Secondary | ICD-10-CM | POA: Insufficient documentation

## 2021-06-27 DIAGNOSIS — I82409 Acute embolism and thrombosis of unspecified deep veins of unspecified lower extremity: Secondary | ICD-10-CM | POA: Insufficient documentation

## 2021-08-30 ENCOUNTER — Encounter: Payer: Self-pay | Admitting: Orthopaedic Surgery

## 2021-08-30 ENCOUNTER — Ambulatory Visit (INDEPENDENT_AMBULATORY_CARE_PROVIDER_SITE_OTHER): Payer: Medicare Other | Admitting: Orthopaedic Surgery

## 2021-08-30 DIAGNOSIS — M25512 Pain in left shoulder: Secondary | ICD-10-CM | POA: Diagnosis not present

## 2021-08-30 DIAGNOSIS — G8929 Other chronic pain: Secondary | ICD-10-CM

## 2021-08-30 DIAGNOSIS — M65311 Trigger thumb, right thumb: Secondary | ICD-10-CM

## 2021-08-30 NOTE — Progress Notes (Signed)
My thumb is better and so is my shoulder  His right trigger thumb is doing well with no pain today.  He is playing golf.  His left shoulder is tender at times but he has good ROM and no new trauma or paresthesia.  Right thumb has no triggering.  NV intact.  Left shoulder has near full ROM with tenderness in the extremes.  Encounter Diagnoses  Name Primary?   Trigger thumb, right thumb Yes   Chronic left shoulder pain    Return in six weeks.  Call if any problem.  Precautions discussed.  Electronically Signed Sanjuana Kava, MD 6/13/20238:19 AM

## 2021-10-11 ENCOUNTER — Encounter: Payer: Self-pay | Admitting: Orthopaedic Surgery

## 2021-10-11 ENCOUNTER — Ambulatory Visit (INDEPENDENT_AMBULATORY_CARE_PROVIDER_SITE_OTHER): Payer: Medicare Other | Admitting: Orthopaedic Surgery

## 2021-10-11 DIAGNOSIS — M25512 Pain in left shoulder: Secondary | ICD-10-CM | POA: Diagnosis not present

## 2021-10-11 DIAGNOSIS — G8929 Other chronic pain: Secondary | ICD-10-CM | POA: Diagnosis not present

## 2021-10-11 NOTE — Progress Notes (Signed)
PROCEDURE NOTE:  The patient request injection, verbal consent was obtained.  The left shoulder was prepped appropriately after time out was performed.   Sterile technique was observed and injection of 1 cc of DepoMedrol '40mg'$  with several cc's of plain xylocaine. Anesthesia was provided by ethyl chloride and a 20-gauge needle was used to inject the shoulder area. A posterior approach was used.  The injection was tolerated well.  A band aid dressing was applied.  The patient was advised to apply ice later today and tomorrow to the injection sight as needed.  Encounter Diagnosis  Name Primary?   Chronic left shoulder pain Yes   Return in six weeks.  Call if any problem.  Precautions discussed.  Electronically Signed Sanjuana Kava, MD 7/25/20238:07 AM

## 2021-11-22 ENCOUNTER — Ambulatory Visit (INDEPENDENT_AMBULATORY_CARE_PROVIDER_SITE_OTHER): Payer: Medicare Other | Admitting: Orthopaedic Surgery

## 2021-11-22 ENCOUNTER — Encounter: Payer: Self-pay | Admitting: Orthopaedic Surgery

## 2021-11-22 VITALS — BP 136/74 | HR 52 | Ht 75.0 in | Wt 248.0 lb

## 2021-11-22 DIAGNOSIS — M25512 Pain in left shoulder: Secondary | ICD-10-CM | POA: Diagnosis not present

## 2021-11-22 DIAGNOSIS — G8929 Other chronic pain: Secondary | ICD-10-CM

## 2021-11-22 NOTE — Progress Notes (Signed)
My shoulder is better today.  The injection last time helped the left shoulder.  He is playing golf and not having much problems.  He still has pain but it is better.  He has no new trauma, no paresthesias.  He has prostate cancer and is taking a medicine by mouth that causes hot flashes for him.  He is to see about getting radiation therapy.  Left shoulder has near full ROM with pain in the extremes.  NV intact.  Encounter Diagnosis  Name Primary?   Chronic left shoulder pain Yes   Return in six weeks.  Call if any problem.  Precautions discussed.  Electronically Signed Sanjuana Kava, MD 9/5/20238:23 AM

## 2021-12-20 DIAGNOSIS — C61 Malignant neoplasm of prostate: Secondary | ICD-10-CM | POA: Insufficient documentation

## 2022-01-03 ENCOUNTER — Ambulatory Visit: Payer: Medicare Other | Admitting: Orthopaedic Surgery

## 2022-01-17 ENCOUNTER — Ambulatory Visit: Payer: Medicare Other | Admitting: Orthopaedic Surgery

## 2022-02-14 ENCOUNTER — Ambulatory Visit: Payer: Medicare Other | Admitting: Orthopaedic Surgery

## 2022-04-02 DIAGNOSIS — M7542 Impingement syndrome of left shoulder: Secondary | ICD-10-CM | POA: Insufficient documentation

## 2022-06-27 DIAGNOSIS — D649 Anemia, unspecified: Secondary | ICD-10-CM | POA: Insufficient documentation

## 2022-06-27 HISTORY — DX: Anemia, unspecified: D64.9

## 2022-07-12 ENCOUNTER — Ambulatory Visit (INDEPENDENT_AMBULATORY_CARE_PROVIDER_SITE_OTHER): Payer: Medicare Other | Admitting: Orthopaedic Surgery

## 2022-07-12 ENCOUNTER — Encounter: Payer: Self-pay | Admitting: Orthopaedic Surgery

## 2022-07-12 DIAGNOSIS — G4733 Obstructive sleep apnea (adult) (pediatric): Secondary | ICD-10-CM | POA: Insufficient documentation

## 2022-07-12 DIAGNOSIS — H401234 Low-tension glaucoma, bilateral, indeterminate stage: Secondary | ICD-10-CM | POA: Insufficient documentation

## 2022-07-12 DIAGNOSIS — M25512 Pain in left shoulder: Secondary | ICD-10-CM

## 2022-07-12 DIAGNOSIS — D485 Neoplasm of uncertain behavior of skin: Secondary | ICD-10-CM | POA: Insufficient documentation

## 2022-07-12 DIAGNOSIS — G8929 Other chronic pain: Secondary | ICD-10-CM | POA: Diagnosis not present

## 2022-07-12 DIAGNOSIS — H57813 Brow ptosis, bilateral: Secondary | ICD-10-CM | POA: Insufficient documentation

## 2022-07-12 HISTORY — DX: Obstructive sleep apnea (adult) (pediatric): G47.33

## 2022-07-12 MED ORDER — METHYLPREDNISOLONE ACETATE 40 MG/ML IJ SUSP
40.0000 mg | Freq: Once | INTRAMUSCULAR | Status: AC
  Administered 2022-07-12: 40 mg via INTRA_ARTICULAR

## 2022-07-12 NOTE — Progress Notes (Signed)
PROCEDURE NOTE:  The patient request injection, verbal consent was obtained.  The left shoulder was prepped appropriately after time out was performed.   Sterile technique was observed and injection of 1 cc of DepoMedrol  with several cc's of plain xylocaine. Anesthesia was provided by ethyl chloride and a 20-gauge needle was used to inject the shoulder area. A posterior approach was used.  The injection was tolerated well.  A band aid dressing was applied.  The patient was advised to apply ice later today and tomorrow to the injection sight as needed.  Encounter Diagnosis  Name Primary?   Chronic left shoulder pain Yes   Return prn.  Call if any problem.  Precautions discussed.  Electronically Signed Darreld Mclean, MD 4/24/20248:31 AM

## 2022-07-12 NOTE — Addendum Note (Signed)
Addended by: Recardo Evangelist A on: 07/12/2022 09:32 AM   Modules accepted: Orders

## 2022-08-15 ENCOUNTER — Ambulatory Visit (INDEPENDENT_AMBULATORY_CARE_PROVIDER_SITE_OTHER): Payer: Medicare Other | Admitting: Orthopaedic Surgery

## 2022-08-15 ENCOUNTER — Encounter: Payer: Self-pay | Admitting: Orthopaedic Surgery

## 2022-08-15 DIAGNOSIS — G8929 Other chronic pain: Secondary | ICD-10-CM | POA: Diagnosis not present

## 2022-08-15 DIAGNOSIS — M25512 Pain in left shoulder: Secondary | ICD-10-CM

## 2022-08-15 NOTE — Progress Notes (Signed)
PROCEDURE NOTE:  The patient request injection, verbal consent was obtained.  The left shoulder was prepped appropriately after time out was performed.   Sterile technique was observed and injection of 1 cc of DepoMedrol 40mg  with several cc's of plain xylocaine. Anesthesia was provided by ethyl chloride and a 20-gauge needle was used to inject the shoulder area. A posterior approach was used.  The injection was tolerated well.  A band aid dressing was applied.  The patient was advised to apply ice later today and tomorrow to the injection sight as needed.  Encounter Diagnosis  Name Primary?   Chronic left shoulder pain Yes   Return in one month.  Call if any problem.  Precautions discussed.  Electronically Signed Darreld Mclean, MD 5/28/20241:38 PM

## 2022-09-06 ENCOUNTER — Ambulatory Visit (INDEPENDENT_AMBULATORY_CARE_PROVIDER_SITE_OTHER): Payer: Medicare Other | Admitting: Orthopaedic Surgery

## 2022-09-06 ENCOUNTER — Encounter: Payer: Self-pay | Admitting: Orthopaedic Surgery

## 2022-09-06 VITALS — BP 115/62 | HR 53 | Ht 75.0 in | Wt 258.0 lb

## 2022-09-06 DIAGNOSIS — G8929 Other chronic pain: Secondary | ICD-10-CM | POA: Diagnosis not present

## 2022-09-06 DIAGNOSIS — M25512 Pain in left shoulder: Secondary | ICD-10-CM | POA: Diagnosis not present

## 2022-09-06 MED ORDER — METHYLPREDNISOLONE ACETATE 40 MG/ML IJ SUSP
40.0000 mg | Freq: Once | INTRAMUSCULAR | Status: AC
  Administered 2022-09-06: 40 mg via INTRA_ARTICULAR

## 2022-09-06 NOTE — Addendum Note (Signed)
Addended by: Earnstine Regal on: 09/06/2022 08:07 AM   Modules accepted: Level of Service

## 2022-09-06 NOTE — Addendum Note (Signed)
Addended by: Michaele Offer on: 09/06/2022 08:41 AM   Modules accepted: Orders

## 2022-09-06 NOTE — Progress Notes (Signed)
PROCEDURE NOTE:  The patient request injection, verbal consent was obtained.  The left shoulder was prepped appropriately after time out was performed.   Sterile technique was observed and injection of 1 cc of DepoMedrol 40mg  with several cc's of plain xylocaine. Anesthesia was provided by ethyl chloride and a 20-gauge needle was used to inject the shoulder area. A posterior approach was used.  The injection was tolerated well.  A band aid dressing was applied.  The patient was advised to apply ice later today and tomorrow to the injection sight as needed.  Encounter Diagnosis  Name Primary?   Chronic left shoulder pain Yes   Return in six weeks.  Call if any problem.  Precautions discussed.  Electronically Signed Darreld Mclean, MD 6/19/20248:07 AM

## 2022-10-17 ENCOUNTER — Encounter: Payer: Self-pay | Admitting: Orthopaedic Surgery

## 2022-10-17 ENCOUNTER — Ambulatory Visit (INDEPENDENT_AMBULATORY_CARE_PROVIDER_SITE_OTHER): Payer: Medicare Other | Admitting: Orthopaedic Surgery

## 2022-10-17 DIAGNOSIS — G8929 Other chronic pain: Secondary | ICD-10-CM

## 2022-10-17 DIAGNOSIS — M25512 Pain in left shoulder: Secondary | ICD-10-CM | POA: Diagnosis not present

## 2022-10-17 MED ORDER — METHYLPREDNISOLONE ACETATE 40 MG/ML IJ SUSP
40.0000 mg | Freq: Once | INTRAMUSCULAR | Status: AC
  Administered 2022-10-17: 40 mg via INTRA_ARTICULAR

## 2022-10-17 NOTE — Progress Notes (Signed)
My shoulder hurts.  PROCEDURE NOTE:  The patient request injection, verbal consent was obtained.  The right shoulder was prepped appropriately after time out was performed.   Sterile technique was observed and injection of 1 cc of DepoMedrol 40mg  with several cc's of plain xylocaine. Anesthesia was provided by ethyl chloride and a 20-gauge needle was used to inject the shoulder area. A posterior approach was used.  The injection was tolerated well.  A band aid dressing was applied.  The patient was advised to apply ice later today and tomorrow to the injection sight as needed.  Encounter Diagnosis  Name Primary?   Chronic left shoulder pain Yes   Return in six weeks.  Call if any problem.  Precautions discussed.  Electronically Signed Darreld Mclean, MD 7/30/20248:29 AM

## 2022-10-25 DIAGNOSIS — H02409 Unspecified ptosis of unspecified eyelid: Secondary | ICD-10-CM | POA: Insufficient documentation

## 2022-11-06 DIAGNOSIS — M25512 Pain in left shoulder: Secondary | ICD-10-CM | POA: Insufficient documentation

## 2022-11-28 ENCOUNTER — Encounter: Payer: Self-pay | Admitting: Orthopaedic Surgery

## 2022-11-28 ENCOUNTER — Ambulatory Visit (INDEPENDENT_AMBULATORY_CARE_PROVIDER_SITE_OTHER): Payer: Medicare Other | Admitting: Orthopaedic Surgery

## 2022-11-28 DIAGNOSIS — M25561 Pain in right knee: Secondary | ICD-10-CM

## 2022-11-28 DIAGNOSIS — G8929 Other chronic pain: Secondary | ICD-10-CM | POA: Diagnosis not present

## 2022-11-28 MED ORDER — METHYLPREDNISOLONE ACETATE 40 MG/ML IJ SUSP
40.0000 mg | Freq: Once | INTRAMUSCULAR | Status: AC
  Administered 2022-11-28: 40 mg via INTRA_ARTICULAR

## 2022-11-28 NOTE — Progress Notes (Signed)
PROCEDURE NOTE:  The patient requests injections of the right knee , verbal consent was obtained.  The right knee was prepped appropriately after time out was performed.   Sterile technique was observed and injection of 1 cc of DepoMedrol 40mg  with several cc's of plain xylocaine. Anesthesia was provided by ethyl chloride and a 20-gauge needle was used to inject the knee area. The injection was tolerated well.  A band aid dressing was applied.  The patient was advised to apply ice later today and tomorrow to the injection sight as needed.   Encounter Diagnosis  Name Primary?   Chronic pain of right knee Yes   Return prn.  Call if any problem.  Precautions discussed.  Electronically Signed Darreld Mclean, MD 9/10/20248:52 AM

## 2022-12-27 DIAGNOSIS — M7552 Bursitis of left shoulder: Secondary | ICD-10-CM | POA: Insufficient documentation

## 2023-02-14 ENCOUNTER — Encounter: Payer: Self-pay | Admitting: Orthopaedic Surgery

## 2023-02-14 ENCOUNTER — Ambulatory Visit (INDEPENDENT_AMBULATORY_CARE_PROVIDER_SITE_OTHER): Payer: Medicare Other | Admitting: Orthopaedic Surgery

## 2023-02-14 DIAGNOSIS — M25512 Pain in left shoulder: Secondary | ICD-10-CM

## 2023-02-14 DIAGNOSIS — G8929 Other chronic pain: Secondary | ICD-10-CM | POA: Diagnosis not present

## 2023-02-14 DIAGNOSIS — M25561 Pain in right knee: Secondary | ICD-10-CM

## 2023-02-14 MED ORDER — METHYLPREDNISOLONE ACETATE 40 MG/ML IJ SUSP
40.0000 mg | Freq: Once | INTRAMUSCULAR | Status: AC
  Administered 2023-02-14: 40 mg via INTRA_ARTICULAR

## 2023-02-14 NOTE — Progress Notes (Signed)
PROCEDURE NOTE:  The patient request injection, verbal consent was obtained.  The left shoulder was prepped appropriately after time out was performed.   Sterile technique was observed and injection of 1 cc of DepoMedrol 40mg  with several cc's of plain xylocaine. Anesthesia was provided by ethyl chloride and a 20-gauge needle was used to inject the shoulder area. A posterior approach was used.  The injection was tolerated well.  A band aid dressing was applied.  The patient was advised to apply ice later today and tomorrow to the injection sight as needed.   PROCEDURE NOTE:  The patient requests injections of the right knee , verbal consent was obtained.  The right knee was prepped appropriately after time out was performed.   Sterile technique was observed and injection of 1 cc of DepoMedrol 40mg  with several cc's of plain xylocaine. Anesthesia was provided by ethyl chloride and a 20-gauge needle was used to inject the knee area. The injection was tolerated well.  A band aid dressing was applied.  The patient was advised to apply ice later today and tomorrow to the injection sight as needed.  Encounter Diagnoses  Name Primary?   Chronic left shoulder pain Yes   Chronic pain of right knee    Return prn.  Call if any problem.  Precautions discussed.  Electronically Signed Darreld Mclean, MD 11/27/20248:47 AM

## 2023-05-23 ENCOUNTER — Ambulatory Visit: Payer: Medicare Other | Admitting: Orthopaedic Surgery

## 2023-05-30 ENCOUNTER — Ambulatory Visit (INDEPENDENT_AMBULATORY_CARE_PROVIDER_SITE_OTHER): Admitting: Orthopaedic Surgery

## 2023-05-30 ENCOUNTER — Encounter: Payer: Self-pay | Admitting: Orthopaedic Surgery

## 2023-05-30 DIAGNOSIS — G8929 Other chronic pain: Secondary | ICD-10-CM | POA: Diagnosis not present

## 2023-05-30 DIAGNOSIS — M25512 Pain in left shoulder: Secondary | ICD-10-CM | POA: Diagnosis not present

## 2023-05-30 MED ORDER — METHYLPREDNISOLONE ACETATE 40 MG/ML IJ SUSP
40.0000 mg | Freq: Once | INTRAMUSCULAR | Status: AC
  Administered 2023-05-30: 40 mg via INTRA_ARTICULAR

## 2023-05-30 NOTE — Progress Notes (Signed)
 PROCEDURE NOTE:  The patient request injection, verbal consent was obtained.  The left shoulder was prepped appropriately after time out was performed.   Sterile technique was observed and injection of 1 cc of DepoMedrol 40mg  with several cc's of plain xylocaine. Anesthesia was provided by ethyl chloride and a 20-gauge needle was used to inject the shoulder area. A posterior approach was used.  The injection was tolerated well.  A band aid dressing was applied.  The patient was advised to apply ice later today and tomorrow to the injection sight as needed.  Encounter Diagnosis  Name Primary?   Chronic left shoulder pain Yes   Return in six weeks.  Call if any problem.  Precautions discussed.  Electronically Signed Darreld Mclean, MD 3/12/20259:33 AM

## 2023-05-30 NOTE — Addendum Note (Signed)
 Addended byCaffie Damme on: 05/30/2023 09:59 AM   Modules accepted: Orders

## 2023-06-18 ENCOUNTER — Other Ambulatory Visit: Payer: Self-pay | Admitting: *Deleted

## 2023-06-18 DIAGNOSIS — K649 Unspecified hemorrhoids: Secondary | ICD-10-CM

## 2023-06-25 ENCOUNTER — Other Ambulatory Visit: Payer: Self-pay | Admitting: *Deleted

## 2023-06-25 DIAGNOSIS — K649 Unspecified hemorrhoids: Secondary | ICD-10-CM

## 2023-06-28 ENCOUNTER — Ambulatory Visit (INDEPENDENT_AMBULATORY_CARE_PROVIDER_SITE_OTHER): Admitting: General Surgery

## 2023-06-28 ENCOUNTER — Encounter: Payer: Self-pay | Admitting: General Surgery

## 2023-06-28 VITALS — BP 120/64 | HR 46 | Temp 97.8°F | Resp 16 | Ht 75.0 in | Wt 261.0 lb

## 2023-06-28 DIAGNOSIS — K640 First degree hemorrhoids: Secondary | ICD-10-CM | POA: Diagnosis not present

## 2023-06-28 MED ORDER — SENNOSIDES-DOCUSATE SODIUM 8.6-50 MG PO TABS: 2.0000 | ORAL_TABLET | Freq: Every day | ORAL | 1 refills | Status: AC

## 2023-06-29 ENCOUNTER — Telehealth: Payer: Self-pay | Admitting: *Deleted

## 2023-06-29 NOTE — Telephone Encounter (Signed)
 Received referral from Susie, [facility] with [facility]~ [phone], ext [phone]~ telephone/ [phone]~ fax.   Referral: [id number] Valid: 06/13/2023- 12/25/2023  Dx: I95.1- Unspecified Hemorrhoids  Patient seen by Franky Macho, MD on 06/28/2023.  Notes sent to [facility].

## 2023-06-29 NOTE — Progress Notes (Signed)
 Norman Blake; [id number]; 1940/07/07   HPI Patient is an 83 year old white male who returns to my care for evaluation and treatment of hemorrhoidal disease.  He states that he has not had any bleeding episodes over the past few months.  He does state that he has an inability to completely evacuate himself with bowel movements.  He does use wipes to help get himself clean.  He denies soiling his underwear.  He does take fiber to help him out.  He does feel there is extra tissue around the anus.  He is concerned that his hemorrhoids are back.  He does have bowel movements daily.  He is on chronic Eliquis for history of DVT in the right leg. 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     DULoxetine (CYMBALTA) 60 MG capsule Take 60 mg by mouth daily.     finasteride (PROSCAR) 5 MG tablet Take 5 mg by mouth at bedtime.     latanoprost (XALATAN) 0.005 % ophthalmic solution Place 1 drop into both eyes at bedtime.     modafinil (PROVIGIL) 100 MG tablet Take 100 mg by mouth daily.     Multiple Vitamin (MULTIVITAMIN) tablet Take 1 tablet by mouth daily.     ORGOVYX 120 MG TABS Take 1 tablet by mouth daily.     pregabalin (LYRICA) 150 MG capsule Take 150 mg by mouth 2 (two) times daily.     simvastatin (ZOCOR) 80  MG tablet Take 40 mg by mouth at bedtime.     tamsulosin (FLOMAX) 0.4 MG CAPS capsule Take 0.4 mg by mouth daily.     timolol (TIMOPTIC) 0.5 % ophthalmic solution 1 drop 2 (two) times daily.     traMADol (ULTRAM) 50 MG tablet Take 50 mg by mouth 4 (four) times daily.     traZODone (DESYREL) 150 MG tablet Take 150 mg by mouth at bedtime.     No current facility-administered medications on file prior to visit.    Allergies  Allergen Reactions   Atorvastatin Calcium Hives and Other (See Comments)   Gabapentin Other (See Comments)    Social History   Substance and Sexual Activity  Alcohol Use No    Social History   Tobacco Use  Smoking Status Former   Current packs/day: 0.00   Average packs/day: 0.5 packs/day for 3.0 years (1.5 ttl pk-yrs)   Types: Cigarettes   Start date: 06/09/1955   Quit date: 06/09/1958   Years since quitting: 65.0  Smokeless Tobacco Never    Review of Systems  Constitutional:  Positive for malaise/fatigue.  HENT: Negative.    Eyes: Negative.   Respiratory: Negative.    Cardiovascular: Negative.   Gastrointestinal: Negative.   Genitourinary:  Positive for frequency and urgency.  Musculoskeletal:  Positive for joint pain.  Skin: Negative.   Neurological: Negative.   Endo/Heme/Allergies: Negative.   Psychiatric/Behavioral: Negative.      Objective   Vitals:   06/28/23 1110  BP: 120/64  Pulse: (!) 46  Resp: 16  Temp: 97.8 F (36.6 C)  SpO2: 95%    Physical Exam Vitals reviewed.  Constitutional:      Appearance: Normal appearance. He is not ill-appearing.  HENT:     Head: Normocephalic and atraumatic.  Cardiovascular:     Rate and Rhythm: Normal rate and regular rhythm.     Heart sounds: Normal heart sounds. No murmur heard.    No friction rub. No gallop.  Pulmonary:     Effort: Pulmonary effort is normal. No respiratory distress.     Breath sounds: Normal breath sounds. No stridor. No wheezing, rhonchi or rales.  Abdominal:      General: Bowel sounds are normal. There is no distension.     Palpations: Abdomen is soft. There is no mass.     Tenderness: There is no abdominal tenderness. There is no guarding or rebound.     Hernia: No hernia is present.  Genitourinary:    Comments: Rectal examination reveals no significant external hemorrhoidal disease.  He does have excess mucosal skin circumferentially around the anus.  Mild grade 1 internal hemorrhoid is noted at the 5 o'clock position.  No thrombosis is present.  He has a weak external sphincter tone.  No blood noted. Skin:    General: Skin is warm and dry.  Neurological:     Mental Status: He is alert and oriented to person, place, and time.     Assessment  Grade 1 internal hemorrhoid which is asymptomatic at the present time.  I told the patient that he does not need hemorrhoidectomy.  He does have weak sphincter tone which may explain his inability to completely evacuate himself. Plan  I have added Senokot as 2 tablets nightly to help make his stools bulkier.  Patient instructed on maneuvers to strengthen his sphincter mechanism which may have been compromised with his radiation therapy for prostate cancer.  He will return in several months should he continue to have issues.  All questions answered.

## 2023-07-11 ENCOUNTER — Encounter: Payer: Self-pay | Admitting: Orthopaedic Surgery

## 2023-07-11 ENCOUNTER — Ambulatory Visit: Admitting: Orthopaedic Surgery

## 2023-07-11 DIAGNOSIS — H905 Unspecified sensorineural hearing loss: Secondary | ICD-10-CM | POA: Insufficient documentation

## 2023-07-11 DIAGNOSIS — G8929 Other chronic pain: Secondary | ICD-10-CM

## 2023-07-11 DIAGNOSIS — R269 Unspecified abnormalities of gait and mobility: Secondary | ICD-10-CM | POA: Insufficient documentation

## 2023-07-11 DIAGNOSIS — H02423 Myogenic ptosis of bilateral eyelids: Secondary | ICD-10-CM | POA: Insufficient documentation

## 2023-07-11 DIAGNOSIS — M25512 Pain in left shoulder: Secondary | ICD-10-CM | POA: Diagnosis not present

## 2023-07-11 MED ORDER — METHYLPREDNISOLONE ACETATE 40 MG/ML IJ SUSP
40.0000 mg | Freq: Once | INTRAMUSCULAR | Status: AC
Start: 2023-07-11 — End: 2023-07-11
  Administered 2023-07-11: 40 mg via INTRA_ARTICULAR

## 2023-07-11 NOTE — Progress Notes (Signed)
 PROCEDURE NOTE:  The patient request injection, verbal consent was obtained.  The left shoulder was prepped appropriately after time out was performed.   Sterile technique was observed and injection of 1 cc of DepoMedrol 40mg  with several cc's of plain xylocaine. Anesthesia was provided by ethyl chloride and a 20-gauge needle was used to inject the shoulder area. A posterior approach was used.  The injection was tolerated well.  A band aid dressing was applied.  The patient was advised to apply ice later today and tomorrow to the injection sight as needed.  Encounter Diagnosis  Name Primary?   Chronic left shoulder pain Yes   Return in six weeks.  Call if any problem.  Precautions discussed.  Electronically Signed Pleasant Brilliant, MD 4/23/20258:38 AM

## 2023-07-11 NOTE — Addendum Note (Signed)
 Addended by: [place] Snow T on: 07/11/2023 08:49 AM   Modules accepted: Orders

## 2023-08-22 ENCOUNTER — Other Ambulatory Visit (INDEPENDENT_AMBULATORY_CARE_PROVIDER_SITE_OTHER): Payer: Self-pay

## 2023-08-22 ENCOUNTER — Ambulatory Visit: Admitting: Orthopaedic Surgery

## 2023-08-22 ENCOUNTER — Encounter: Payer: Self-pay | Admitting: Orthopaedic Surgery

## 2023-08-22 VITALS — BP 114/65 | HR 54 | Ht 75.0 in | Wt 261.0 lb

## 2023-08-22 DIAGNOSIS — M25562 Pain in left knee: Secondary | ICD-10-CM

## 2023-08-22 DIAGNOSIS — M25572 Pain in left ankle and joints of left foot: Secondary | ICD-10-CM

## 2023-08-22 DIAGNOSIS — G8929 Other chronic pain: Secondary | ICD-10-CM | POA: Diagnosis not present

## 2023-08-22 NOTE — Progress Notes (Signed)
 I fell  He partial fell against his bed four days ago and hurt his left knee, left lower leg and left ankle.  He did not fall to the floor.  He was putting his pants on.  He has stayed off the left leg as much as he could but he still has some pain.  He has no redness, no weakness, no inability to move his ankle.  The left knee has ROM 0 to 105 with effusion and crepitus, knee is stable, no redness, NV intact.  No defect of left Achilles or of calf area.  No redness.  He has bilateral slight edema of the ankles.  NV intact of the ankle and foot.  Gait with slight limp to the left.  X-rays were done of the left ankle and left knee, reported separately.  Encounter Diagnoses  Name Primary?   Chronic pain of left knee Yes   Pain in left ankle and joints of left foot    I told him to ice or heat the area.  Use Aspercreme, Biofreeze or Voltaren gel.  Put pants on seated.  Return in one week.  Call if any problem.  Precautions discussed.  Electronically Signed Pleasant Brilliant, MD 6/4/202510:35 AM

## 2023-08-29 ENCOUNTER — Ambulatory Visit (INDEPENDENT_AMBULATORY_CARE_PROVIDER_SITE_OTHER): Admitting: Orthopaedic Surgery

## 2023-08-29 ENCOUNTER — Encounter: Payer: Self-pay | Admitting: Orthopaedic Surgery

## 2023-08-29 DIAGNOSIS — M25562 Pain in left knee: Secondary | ICD-10-CM | POA: Diagnosis not present

## 2023-08-29 DIAGNOSIS — G8929 Other chronic pain: Secondary | ICD-10-CM | POA: Diagnosis not present

## 2023-08-29 NOTE — Progress Notes (Signed)
 My knee is still hurting.  He has more pain of the lateral hamstring on the left knee.  It hurts after driving a while. He can get around most days but some days he has much more pain.  He has no new trauma, no redness, no swelling.  Left knee is tender on lateral hamstring at the knee.  I feel no defect. It is very tender, not swollen or red.  Knee is stable, ROM 0 to 110, limp to the left, NV intact.  Encounter Diagnosis  Name Primary?   Chronic pain of left knee Yes   I will get MRI of the left knee.  Return in three weeks.  Call if any problem.  Precautions discussed.  Electronically Signed Pleasant Brilliant, MD 6/11/202510:17 AM

## 2023-09-03 ENCOUNTER — Telehealth: Payer: Self-pay | Admitting: Orthopaedic Surgery

## 2023-09-03 NOTE — Telephone Encounter (Signed)
 Spoke with pt wife and she says to go ahead and schedule without the [place] authorization and she will pay out of pocket.

## 2023-09-03 NOTE — Telephone Encounter (Signed)
 Dr. Vicente Graham pt - pt lvm stating that he want to have a MRI.  [phone]

## 2023-09-06 ENCOUNTER — Ambulatory Visit (HOSPITAL_COMMUNITY)
Admission: RE | Admit: 2023-09-06 | Discharge: 2023-09-06 | Disposition: A | Discharge status: Home / Self Care | Source: Ambulatory Visit | Attending: Orthopaedic Surgery | Admitting: Orthopaedic Surgery

## 2023-09-06 DIAGNOSIS — G8929 Other chronic pain: Secondary | ICD-10-CM | POA: Diagnosis present

## 2023-09-06 DIAGNOSIS — M25562 Pain in left knee: Secondary | ICD-10-CM | POA: Diagnosis present

## 2023-09-19 ENCOUNTER — Ambulatory Visit (INDEPENDENT_AMBULATORY_CARE_PROVIDER_SITE_OTHER): Admitting: Orthopaedic Surgery

## 2023-09-19 DIAGNOSIS — M25512 Pain in left shoulder: Secondary | ICD-10-CM

## 2023-09-19 DIAGNOSIS — M25562 Pain in left knee: Secondary | ICD-10-CM | POA: Diagnosis not present

## 2023-09-19 DIAGNOSIS — G8929 Other chronic pain: Secondary | ICD-10-CM | POA: Diagnosis not present

## 2023-09-19 MED ORDER — METHYLPREDNISOLONE ACETATE 40 MG/ML IJ SUSP
40.0000 mg | Freq: Once | INTRAMUSCULAR | Status: AC
  Administered 2023-09-19: 40 mg via INTRA_ARTICULAR

## 2023-09-19 NOTE — Addendum Note (Signed)
 Addended by: MARCINE HUSBAND T on: 09/19/2023 11:06 AM   Modules accepted: Orders

## 2023-09-19 NOTE — Progress Notes (Signed)
 My knee is better.  He had MRI of the left knee showing: IMPRESSION: Large tear to the root of the posterior horn of the medial meniscus with the body extruded medially. Myxoid degeneration to the body without tear.   Mild medial and patellofemoral compartment chondromalacia with minimal degenerative edema to the patella.   Small joint effusion and small popliteal cyst.  I have explained the findings to him.  He does not want to consider any surgery at the present time.  I have independently reviewed the MRI.    His left shoulder is bothering him more.  He has no new trauma.  Left shoulder has good ROM with pain in the extremes.  NV intact.  Left knee has slight effusion, crepitus, NV intact, limp left slightly, ROM 0 to 110, positive medial McMurray, no distal edema.  Encounter Diagnoses  Name Primary?   Chronic left shoulder pain Yes   Chronic pain of left knee    PROCEDURE NOTE:  The patient request injection, verbal consent was obtained.  The left shoulder was prepped appropriately after time out was performed.   Sterile technique was observed and injection of 1 cc of DepoMedrol 40mg  with several cc's of plain xylocaine. Anesthesia was provided by ethyl chloride and a 20-gauge needle was used to inject the shoulder area. A posterior approach was used.  The injection was tolerated well.  A band aid dressing was applied.  The patient was advised to apply ice later today and tomorrow to the injection sight as needed.  Return in one month.  Call if any problem.  Precautions discussed.  Electronically Signed Lemond Stable, MD 7/2/20259:16 AM

## 2023-10-17 ENCOUNTER — Ambulatory Visit (INDEPENDENT_AMBULATORY_CARE_PROVIDER_SITE_OTHER): Admitting: Orthopaedic Surgery

## 2023-10-17 ENCOUNTER — Telehealth: Payer: Self-pay | Admitting: Orthopaedic Surgery

## 2023-10-17 ENCOUNTER — Encounter: Payer: Self-pay | Admitting: Orthopaedic Surgery

## 2023-10-17 DIAGNOSIS — G8929 Other chronic pain: Secondary | ICD-10-CM | POA: Diagnosis not present

## 2023-10-17 DIAGNOSIS — M25562 Pain in left knee: Secondary | ICD-10-CM | POA: Diagnosis not present

## 2023-10-17 NOTE — Telephone Encounter (Signed)
 This patient presented for his appointment today w/a [facility] authorization to be seen.  I have scanned it into his documents under insurance.  I have added [facility] to his chart, but I wanted to make you aware.  I told him that was not the normal process, that the [place] normally coordinates that w/the office PRIOR to the visit.  He was scheduled to see Dr. Brenna for an injection today and asked that that be changed to an office visit.  He mentioned surgery, if Dr. Brenna thinks he needs surgery he'll refer him to one of our doctors, told him he may need another authorization for that.

## 2023-10-17 NOTE — Progress Notes (Signed)
 My knee is better.  He has less pain in the left knee.  MRI had shown medial meniscus tear.  He does not want surgery. He wants to go back playing golf.  He has no new trauma.  His swelling is down.  ROM of the left knee is 0 to 110, he has some slight effusion and crepitus, positive medial joint line pain, positive medial McMurray and slight left limp.  NV intact.  Encounter Diagnosis  Name Primary?   Chronic pain of left knee Yes   I told him about possible surgery again but he would rather wait and see how he does.  I told him he could play golf but just be careful and use common sense.    I will not make a regular appointment to come back but he is to call if his pain gets worse or he might consider an injection.  I will leave it open ended.  Call if any problem.  Precautions discussed.  Electronically Signed Lemond Stable, MD 7/30/20252:45 PM

## 2023-11-07 ENCOUNTER — Ambulatory Visit (INDEPENDENT_AMBULATORY_CARE_PROVIDER_SITE_OTHER)

## 2023-11-07 ENCOUNTER — Ambulatory Visit (INDEPENDENT_AMBULATORY_CARE_PROVIDER_SITE_OTHER): Admitting: Orthopaedic Surgery

## 2023-11-07 DIAGNOSIS — M25511 Pain in right shoulder: Secondary | ICD-10-CM | POA: Diagnosis not present

## 2023-11-07 DIAGNOSIS — G8929 Other chronic pain: Secondary | ICD-10-CM

## 2023-11-07 DIAGNOSIS — M79671 Pain in right foot: Secondary | ICD-10-CM | POA: Diagnosis not present

## 2023-11-07 DIAGNOSIS — M67471 Ganglion, right ankle and foot: Secondary | ICD-10-CM

## 2023-11-07 MED ORDER — METHYLPREDNISOLONE ACETATE 40 MG/ML IJ SUSP
40.0000 mg | Freq: Once | INTRAMUSCULAR | Status: AC
  Administered 2023-11-07: 40 mg via INTRA_ARTICULAR

## 2023-11-07 NOTE — Addendum Note (Signed)
 Addended by: MARCINE HUSBAND T on: 11/07/2023 11:42 AM   Modules accepted: Orders

## 2023-11-07 NOTE — Progress Notes (Addendum)
 My foot has swelling, my shoulder is hurting again.  He has recurrent pain in the right shoulder.  He wants an injection.  He has developed swelling of the right dorsal foot that gets big at times and other times not as much.  He has no redness.  He has been seen at [facility] for this.  He has no redness.  He has neuropathy of the feet.  He has no trauma.    Right shoulder has good but painful ROM.  No swelling or redness.  NV intact.  Right dorsal foot at forefoot has a large ganglion feeling cyst with fluid.  It is not red.  Sensation decreased of the foot but has good circulation.  Gait is good.  X-rays were done of the right foot, reported separately.  Encounter Diagnoses  Name Primary?   Pain in right foot Yes   Chronic right shoulder pain    Ganglion cyst of right foot     Procedure note: The right foot was prepped over the ganglion area and 1% xylocaine was used as anesthesia.  I then used a 16 gauge needle and by sterile technique aspirated 6 cc and was able to express another 10 cc or so from the foot by pressure tolerated well.  He is to use ice later. I will most likely recur and he needs to consider surgery.  PROCEDURE NOTE:  The patient request injection, verbal consent was obtained.  The right shoulder was prepped appropriately after time out was performed.   Sterile technique was observed and injection of 1 cc of DepoMedrol 40mg  with several cc's of plain xylocaine. Anesthesia was provided by ethyl chloride and a 20-gauge needle was used to inject the shoulder area. A posterior approach was used.  The injection was tolerated well.  A band aid dressing was applied.  The patient was advised to apply ice later today and tomorrow to the injection sight as needed.  Return in six weeks.  Call if any problem.  Precautions discussed.  Electronically Signed Lemond Stable, MD 8/20/202510:25 AM

## 2023-11-29 ENCOUNTER — Ambulatory Visit (INDEPENDENT_AMBULATORY_CARE_PROVIDER_SITE_OTHER): Admitting: Orthopaedic Surgery

## 2023-11-29 ENCOUNTER — Encounter: Payer: Self-pay | Admitting: Orthopaedic Surgery

## 2023-11-29 DIAGNOSIS — M67471 Ganglion, right ankle and foot: Secondary | ICD-10-CM

## 2023-11-29 DIAGNOSIS — M79671 Pain in right foot: Secondary | ICD-10-CM | POA: Diagnosis not present

## 2023-11-29 NOTE — Progress Notes (Signed)
 I have more swelling in the right foot again  I had aspirated a ganglion on the dorsum of the right foot.  The fluid has returned.  He has no new trauma, no redness, no numbness.  The dorsum of the right foot has a diffuse ganglion more over the third to fourth metatarsal shaft areas. There is no redness.  Encounter Diagnoses  Name Primary?   Pain in right foot Yes   Ganglion cyst of right foot    Procedure note: After permission from the patient and prep of the dorsum of the right foot, I used 1% Xylocaine injection as an anesthetic.  Then I aspirated bloody thick material from the cyst,about 3 to 4 cc.  It was done by sterile technique and tolerated well.  He may need to have the cyst surgically excised.  Return in three weeks to the [place] office.  I am retiring that day.  He is aware of that.  Call if any problem.  Precautions discussed.  Electronically Signed Lemond Stable, MD 9/11/202510:02 AM

## 2023-12-19 ENCOUNTER — Ambulatory Visit: Admitting: Orthopaedic Surgery

## 2023-12-19 ENCOUNTER — Encounter: Payer: Self-pay | Admitting: Orthopaedic Surgery

## 2023-12-19 DIAGNOSIS — G8929 Other chronic pain: Secondary | ICD-10-CM | POA: Diagnosis not present

## 2023-12-19 DIAGNOSIS — M67471 Ganglion, right ankle and foot: Secondary | ICD-10-CM

## 2023-12-19 DIAGNOSIS — M25512 Pain in left shoulder: Secondary | ICD-10-CM | POA: Diagnosis not present

## 2023-12-19 MED ORDER — METHYLPREDNISOLONE ACETATE 40 MG/ML IJ SUSP
40.0000 mg | Freq: Once | INTRAMUSCULAR | Status: AC
  Administered 2023-12-19: 40 mg via INTRA_ARTICULAR

## 2023-12-19 NOTE — Addendum Note (Signed)
 Addended by: MARCINE HUSBAND T on: 12/19/2023 01:28 PM   Modules accepted: Orders

## 2023-12-19 NOTE — Progress Notes (Signed)
 PROCEDURE NOTE:  The patient request injection, verbal consent was obtained.  The left shoulder was prepped appropriately after time out was performed.   Sterile technique was observed and injection of 1 cc of DepoMedrol 40mg  with several cc's of plain xylocaine. Anesthesia was provided by ethyl chloride and a 20-gauge needle was used to inject the shoulder area. A posterior approach was used.  The injection was tolerated well.  A band aid dressing was applied.  The patient was advised to apply ice later today and tomorrow to the injection sight as needed.  The swelling of the right foot is minimal today.  NV intact.  Encounter Diagnoses  Name Primary?   Chronic left shoulder pain Yes   Ganglion cyst of right foot    Return in six weeks.  I have informed the patient I will be retiring from medical practice and from this office on December 20, 2023.  The patient has been offered continuing care with Dr. Margrette or Dr. Onesimo of this office.  The patient may choose another provider and the records will be forwarded after proper signature and notification.  Patient understands and agrees.  Call if any problem.  Precautions discussed.  Electronically Signed Lemond Stable, MD 10/1/20259:20 AM

## 2024-01-30 ENCOUNTER — Encounter: Payer: Self-pay | Admitting: Orthopedic Surgery

## 2024-01-30 ENCOUNTER — Other Ambulatory Visit: Payer: Self-pay

## 2024-01-30 ENCOUNTER — Telehealth: Payer: Self-pay

## 2024-01-30 ENCOUNTER — Ambulatory Visit: Admitting: Orthopedic Surgery

## 2024-01-30 DIAGNOSIS — G8929 Other chronic pain: Secondary | ICD-10-CM

## 2024-01-30 DIAGNOSIS — R531 Weakness: Secondary | ICD-10-CM | POA: Insufficient documentation

## 2024-01-30 DIAGNOSIS — M25512 Pain in left shoulder: Secondary | ICD-10-CM

## 2024-01-30 DIAGNOSIS — E559 Vitamin D deficiency, unspecified: Secondary | ICD-10-CM | POA: Insufficient documentation

## 2024-01-30 DIAGNOSIS — R4189 Other symptoms and signs involving cognitive functions and awareness: Secondary | ICD-10-CM | POA: Insufficient documentation

## 2024-01-30 DIAGNOSIS — I639 Cerebral infarction, unspecified: Secondary | ICD-10-CM | POA: Insufficient documentation

## 2024-01-30 MED ORDER — METHYLPREDNISOLONE ACETATE 40 MG/ML IJ SUSP
40.0000 mg | Freq: Once | INTRAMUSCULAR | Status: AC
  Administered 2024-01-30: 40 mg via INTRA_ARTICULAR

## 2024-01-30 NOTE — Progress Notes (Signed)
 Chief Complaint  Patient presents with   Shoulder Pain    left    83 year old male followed by Dr. Brenna for left shoulder pain the patient has not had an x-ray in several years and it was of his clavicle  He complains of pain around the left shoulder joint and that it gets out of place.  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   Obstructive sleep apnea (adult) (pediatric) 07/12/2022    Focused shoulder examination shows abduction to 90 flexion to 110 mild weakness in the rotator cuff in the empty can position no tenderness around the shoulder joint Neurovascular exam is intact and skin is normal without rash or lesion   DG Shoulder Left Result Date: 01/30/2024 X-ray left shoulder Image report Left shoulder pain chronic Normal glenohumeral joint curved acromion normal humerus chest wall looks good clavicle looks normal Impression normal left shoulder    Assessment and plan  Chronic shoulder pain does not appear to have arthritis probably has a chronic rotator cuff tear  Okay to manage with injection   Procedure note the subacromial injection shoulder left   Verbal consent was obtained to inject the  Left   Shoulder  Timeout was completed to confirm the injection site is a subacromial space of the  left  shoulder  Medication used Depo-Medrol  40 mg and lidocaine 1% 3 cc  Anesthesia was provided by ethyl chloride  The injection was performed in the left  posterior subacromial space. After pinning the skin with alcohol and anesthetized the skin with ethyl chloride the subacromial space was injected using a 20-gauge  needle. There were no complications  Sterile dressing was applied.   Return as needed for injection

## 2024-01-30 NOTE — Progress Notes (Signed)
    01/30/2024   Chief Complaint  Patient presents with   Shoulder Pain    left    No diagnosis found.  What pharmacy do you use ? ______[facility]_____  DOI/DOS/ Date:    Did you get better, worse or no change (Answer below)   Unchanged WANTS INJECT

## 2024-01-30 NOTE — Telephone Encounter (Signed)
 Norman Blake is asking about Dr. Margrette writing a letter for her showing that she has to help [name] with his daily activities. She is his caregiver.

## 2024-01-30 NOTE — Telephone Encounter (Signed)
 Note is in system, she can pick up, it will print from letter tab, I did not print it out because we had so many in pick up drawer at one point, I was asked not to print them

## 2024-01-30 NOTE — Telephone Encounter (Signed)
 Ok to provide note as she requested?

## 2024-04-02 ENCOUNTER — Telehealth: Payer: Self-pay | Admitting: Orthopedic Surgery

## 2024-04-02 NOTE — Telephone Encounter (Signed)
 I received a fax from Dr Neill office about clearance for Eliquis. The patient told the office Dr Margrette provides Eliquis for him, but I advised Dr Margrette does not prescribe that medication  They voiced understanding and told me patient cancelled procedure anyway.

## 2024-05-08 ENCOUNTER — Ambulatory Visit: Admitting: Orthopedic Surgery
